# Patient Record
Sex: Male | Born: 1980 | Race: White | Hispanic: Yes | Marital: Single | Smoking: Never smoker
Health system: Southern US, Community
[De-identification: ages and names within clinical notes are randomized; demographics above are authoritative.]

## PROBLEM LIST (undated history)

## (undated) DIAGNOSIS — Z789 Other specified health status: Secondary | ICD-10-CM

## (undated) HISTORY — PX: APPENDECTOMY: SHX54

---

## 2003-08-17 ENCOUNTER — Encounter: Payer: Self-pay | Admitting: Emergency Medicine

## 2003-08-17 ENCOUNTER — Emergency Department (HOSPITAL_COMMUNITY): Admission: EM | Admit: 2003-08-17 | Discharge: 2003-08-17 | Payer: Self-pay | Admitting: Emergency Medicine

## 2008-06-26 ENCOUNTER — Emergency Department (HOSPITAL_COMMUNITY): Admission: EM | Admit: 2008-06-26 | Discharge: 2008-06-26 | Payer: Self-pay | Admitting: Emergency Medicine

## 2008-06-30 ENCOUNTER — Emergency Department (HOSPITAL_COMMUNITY): Admission: EM | Admit: 2008-06-30 | Discharge: 2008-06-30 | Payer: Self-pay | Admitting: Emergency Medicine

## 2019-07-05 ENCOUNTER — Encounter (HOSPITAL_COMMUNITY): Payer: Self-pay | Admitting: Emergency Medicine

## 2019-07-05 ENCOUNTER — Other Ambulatory Visit: Payer: Self-pay

## 2019-07-05 ENCOUNTER — Emergency Department (HOSPITAL_COMMUNITY)
Admission: EM | Admit: 2019-07-05 | Discharge: 2019-07-05 | Disposition: A | Discharge status: Home / Self Care | Payer: Worker's Compensation | Attending: Emergency Medicine | Admitting: Emergency Medicine

## 2019-07-05 ENCOUNTER — Emergency Department (HOSPITAL_COMMUNITY): Payer: Worker's Compensation

## 2019-07-05 ENCOUNTER — Emergency Department: Payer: Self-pay

## 2019-07-05 DIAGNOSIS — Y999 Unspecified external cause status: Secondary | ICD-10-CM | POA: Diagnosis not present

## 2019-07-05 DIAGNOSIS — R52 Pain, unspecified: Secondary | ICD-10-CM

## 2019-07-05 DIAGNOSIS — Y9301 Activity, walking, marching and hiking: Secondary | ICD-10-CM | POA: Diagnosis not present

## 2019-07-05 DIAGNOSIS — Y929 Unspecified place or not applicable: Secondary | ICD-10-CM | POA: Insufficient documentation

## 2019-07-05 DIAGNOSIS — S52571A Other intraarticular fracture of lower end of right radius, initial encounter for closed fracture: Secondary | ICD-10-CM | POA: Diagnosis not present

## 2019-07-05 DIAGNOSIS — W0110XA Fall on same level from slipping, tripping and stumbling with subsequent striking against unspecified object, initial encounter: Secondary | ICD-10-CM | POA: Insufficient documentation

## 2019-07-05 DIAGNOSIS — S6981XA Other specified injuries of right wrist, hand and finger(s), initial encounter: Secondary | ICD-10-CM | POA: Diagnosis present

## 2019-07-05 MED ORDER — HYDROCODONE-ACETAMINOPHEN 5-325 MG PO TABS
1.0000 | ORAL_TABLET | Freq: Once | ORAL | Status: AC
  Administered 2019-07-05: 1 via ORAL
  Filled 2019-07-05: qty 1

## 2019-07-05 MED ORDER — HYDROCODONE-ACETAMINOPHEN 5-325 MG PO TABS: 1.0000 | ORAL_TABLET | Freq: Four times a day (QID) | ORAL | 0 refills | Status: DC | PRN

## 2019-07-05 NOTE — ED Notes (Signed)
Ortho tech at bedside at this time 

## 2019-07-05 NOTE — ED Notes (Signed)
Patient verbalizes understanding of discharge instructions. Opportunity for questioning and answers were provided.  pt discharged from ED with family. Ambulatory by self   

## 2019-07-05 NOTE — ED Notes (Signed)
Radiology brought patient xray CD back and given to patient. Unable to transfer xray from CD to Epic.

## 2019-07-05 NOTE — Progress Notes (Signed)
Orthopedic Tech Progress Note Patient Details:  Angel Yates [birth year] [id number]  Ortho Devices Type of Ortho Device: Long arm splint, Arm sling Ortho Device/Splint Location: right Ortho Device/Splint Interventions: Application   Post Interventions Patient Tolerated: Well Instructions Provided: Care of device   [place] Pink 07/05/2019, 3:43 PM

## 2019-07-05 NOTE — ED Notes (Signed)
Provider at bedside

## 2019-07-05 NOTE — ED Triage Notes (Signed)
Pt fell and injured RUE on Monday, was seen at urgent care this am and told he has a fracture.

## 2019-07-05 NOTE — ED Notes (Signed)
Currently waiting for lab to send COVID swabs currently out in the ED.

## 2019-07-05 NOTE — ED Provider Notes (Signed)
[facility]   CSN: [id number] Arrival date & time: 07/05/19  1058    History   Chief Complaint Chief Complaint  Patient presents with  . Arm Injury    HPI Angel Yates is a 38 y.o. male who presents today for evaluation of right wrist pain.  He reports that on Monday, August 3 he was walking outside when he had a mechanical slip and fell hitting his right wrist.  He reports that over the past 5 days he has been resting at home.  He took ibuprofen 400 mg last night with mild relief of pain.  He denies any other injuries from this fall.  He denies any blood thinning medications.  He denies any numbness or tingling in his right hand.  [facility] show that he had a x-ray of his right hand performed, read as "comminuted intra-articular fracture distal radius.  Moderate dorsal impaction noted.  4 mm gap in the articular surface.  4 mm depression dorsal radius fragment."  He denies any wounds on his arm.  Last oral intake was dinner last night.    Patient was offered professional Orthoptistmedical translator, he refused stating that he wished for his friend in the room to translate for him      HPI  History reviewed. No pertinent past medical history.    There are no active problems to display for this patient.   History reviewed. No pertinent surgical history.      Home Medications    Prior to Admission medications   Medication Sig Start Date End Date Taking? Authorizing Provider  HYDROcodone-acetaminophen (NORCO/VICODIN) 5-325 MG tablet Take 1 tablet by mouth every 6 (six) hours as needed. 07/05/19   Cristina GongHammond, Bertice Risse W, PA-C    Family History No family history on file.  Social History Social History   Tobacco Use  . Smoking status: Never Smoker  . Smokeless tobacco: Never Used  Substance Use Topics  . Alcohol use: Never    Frequency: Never  . Drug use: Never     Allergies    Patient has no allergy information on record.   Review of Systems Review of Systems  Constitutional: Negative for chills and fever.  Musculoskeletal:       Right wrist pain  Skin: Negative for wound.  Neurological: Negative for weakness, numbness and headaches.  All other systems reviewed and are negative.    Physical Exam Updated Vital Signs BP (!) 139/100 (BP Location: Left Arm)   Pulse 72   Temp 98.3 F (36.8 C) (Oral)   Resp 18   SpO2 100%   Physical Exam Vitals signs and nursing note reviewed.  Constitutional:      General: He is not in acute distress.    Appearance: He is well-developed. He is not diaphoretic.  HENT:     Head: Normocephalic and atraumatic.  Eyes:     General: No scleral icterus.       Right eye: No discharge.        Left eye: No discharge.     Conjunctiva/sclera: Conjunctivae normal.  Neck:     Musculoskeletal: Normal range of motion and neck supple.  Cardiovascular:     Rate and Rhythm: Normal rate and regular rhythm.     Pulses: Normal pulses.  Pulmonary:     Effort: Pulmonary effort is normal. No respiratory distress.     Breath sounds: No stridor.  Abdominal:  General: There is no distension.     Tenderness: There is no abdominal tenderness.  Musculoskeletal:        General: No deformity.     Comments: There is an obvious deformity of the right wrist with ulnar deviation.  Finger range of motion on the right hand is limited secondary to wrist pain.  Skin:    General: Skin is warm and dry.     Capillary Refill: Capillary refill takes less than 2 seconds.     Comments: No scrapes, abrasions or lacerations on the right hand/arm.  Neurological:     Mental Status: He is alert.     Sensory: No sensory deficit.     Motor: No abnormal muscle tone.  Psychiatric:        Mood and Affect: Mood normal.        Behavior: Behavior normal.            ED Treatments / Results  Labs (all labs ordered are listed, but only abnormal  results are displayed) Labs Reviewed - No data to display  EKG None  Radiology Dg Forearm Right  Result Date: 07/05/2019 CLINICAL DATA:  Fall with right wrist pain.  Initial encounter. EXAM: RIGHT FOREARM - 2 VIEW COMPARISON:  None. FINDINGS: The entire forearm is in a splint/cast. There is a comminuted and mildly displaced fracture of the distal radius that extends into the radiocarpal joint. No proximal ulnar or radial fractures are identified. Alignment at the elbow appears intact. IMPRESSION: Comminuted fracture of the distal right radius demonstrating mild displacement. Electronically Signed   By: Irish LackGlenn  Yamagata M.D.   On: 07/05/2019 12:23   Dg Wrist Complete Right  Result Date: 07/05/2019 CLINICAL DATA:  Recent fall with wrist pain. EXAM: RIGHT WRIST - COMPLETE 3+ VIEW COMPARISON:  None. FINDINGS: The forearm is splinted. There is a comminuted and displaced intra-articular fracture of the distal radius. This fracture is associated with up to 4 mm of distal articular surface offset and up to 4 mm of posterior displacement. The fracture extends into the distal radioulnar joint. The distal ulna appears intact. No carpal bone fractures are identified. There is an old fracture of distal 5th metacarpal. IMPRESSION: Comminuted and mildly displaced intra-articular fracture of the distal radius as described. Electronically Signed   By: Carey BullocksWilliam  Veazey M.D.   On: 07/05/2019 12:22    Procedures .Splint Application  Date/Time: 07/05/2019 6:28 PM Performed by: Cristina GongHammond, Narek Kniss W, PA-C Authorized by: Cristina GongHammond, Caspar Favila W, PA-C   Consent:    Consent obtained:  Verbal   Consent given by:  Patient   Risks discussed:  Discoloration, numbness, pain and swelling   Alternatives discussed:  No treatment, alternative treatment and referral Pre-procedure details:    Sensation:  Normal   Skin color:  Normal Procedure details:    Laterality:  Right   Location:  Wrist   Wrist:  R wrist   Strapping: no      Splint type:  Thumb spica and sugar tong   Supplies:  Ortho-Glass, sling and cotton padding Post-procedure details:    Pain:  Improved   Sensation:  Normal   Skin color:  Unchanged   Patient tolerance of procedure:  Tolerated well, no immediate complications Comments:     Splint applied by orthopedic tech.    (including critical care time)  Medications Ordered in ED Medications  HYDROcodone-acetaminophen (NORCO/VICODIN) 5-325 MG per tablet 1 tablet (1 tablet Oral Given 07/05/19 1545)     Initial Impression /  Assessment and Plan / ED Course  I have reviewed the triage vital signs and the nursing notes.  Pertinent labs & imaging results that were available during my care of the patient were reviewed by me and considered in my medical decision making (see chart for details).  Clinical Course as of Jul 05 1827  Sat Jul 05, 2019  1529 Spoke with on-call hand who recommends splinting patient.  Have him call the office on Monday for an appointment on Tuesday.   [EH]  [place] in room.   [EH]    Clinical Course User Index [EH] Lorin Glass, PA-C      Patient presents today for evaluation of pain in his right wrist.  He was originally seen at urgent care where hand x-rays were obtained and he was reportedly diagnosed with a distal radial fracture.  He has significant scaphoid tenderness, therefore wrist specific and forearm images were obtained to evaluate scaphoid and remainder of the radius.  His fall happened 5 days ago.  He is neurovascularly intact on my exam.  I spoke with on-call hand surgeon who recommended splint and sling and follow-up in the office. The splint the patient arrived with was insufficient.  Splint was redone while in the department.  After which he reported improvement in his pain.  His pain is treated with Vicodin while in the emergency room, in addition to given a prescription for additional doses of Vicodin with instructions on over-the-counter  medications.  I attempted to use professional medical interpreter, however patient refused wishing for his friend to interpret for him as needed.  Return precautions were discussed with patient who states their understanding.  At the time of discharge patient denied any unaddressed complaints or concerns.  Patient is agreeable for discharge home.   Final Clinical Impressions(s) / ED Diagnoses   Final diagnoses:  Pain  Other closed intra-articular fracture of distal end of right radius, initial encounter    ED Discharge Orders         Ordered    HYDROcodone-acetaminophen (NORCO/VICODIN) 5-325 MG tablet  Every 6 hours PRN     07/05/19 1615           Lorin Glass, Hershal Coria 07/05/19 1833    Virgel Manifold, MD 07/09/19 1455

## 2019-07-05 NOTE — Discharge Instructions (Signed)

## 2019-07-05 NOTE — ED Notes (Signed)
Patient transported to X-ray 

## 2019-07-10 ENCOUNTER — Other Ambulatory Visit (HOSPITAL_COMMUNITY)
Admission: RE | Admit: 2019-07-10 | Discharge: 2019-07-10 | Disposition: A | Discharge status: Home / Self Care | Payer: HRSA Program | Source: Ambulatory Visit | Attending: Orthopaedic Surgery | Admitting: Orthopaedic Surgery

## 2019-07-10 DIAGNOSIS — Z01812 Encounter for preprocedural laboratory examination: Secondary | ICD-10-CM | POA: Diagnosis not present

## 2019-07-10 DIAGNOSIS — Z20828 Contact with and (suspected) exposure to other viral communicable diseases: Secondary | ICD-10-CM | POA: Insufficient documentation

## 2019-07-11 ENCOUNTER — Encounter (HOSPITAL_COMMUNITY): Payer: Self-pay | Admitting: *Deleted

## 2019-07-11 ENCOUNTER — Other Ambulatory Visit: Payer: Self-pay

## 2019-07-11 LAB — SARS CORONAVIRUS 2 (TAT 6-24 HRS): SARS Coronavirus 2: NEGATIVE

## 2019-07-11 NOTE — Progress Notes (Signed)
Spoke with pt for pre-op call via [employer], [place] [phone]. Pt denies cardiac history, HTN or Diabetes.  Pt had Covid test done yesterday and it is negative. Pt states he's been in quarantine since testing.   Pt states he will have someone come to the hospital today to pick up the [employer] Ensure drink. Pt understands he drinks it at 6:15 AM Monday, 07/14/19.    Coronavirus Screening  Have you experienced the following symptoms:  Cough NO Fever (>100.36F) NO Runny nose NO Sore throat NO Difficulty breathing/shortness of breath  NO  Have you or a family member traveled in the last 14 days and where? NO   Patient reminded that hospital visitation restrictions are in effect and the importance of the restrictions. Pt informed he may have 1 visitor sit in the waiting room while he is in pre-op, surgery and PACU. He voiced understanding.

## 2019-07-14 ENCOUNTER — Encounter (HOSPITAL_COMMUNITY): Payer: Self-pay | Admitting: *Deleted

## 2019-07-14 ENCOUNTER — Other Ambulatory Visit: Payer: Self-pay

## 2019-07-14 ENCOUNTER — Observation Stay (HOSPITAL_COMMUNITY)
Admission: RE | Admit: 2019-07-14 | Discharge: 2019-07-15 | Disposition: A | Discharge status: Home / Self Care | Payer: Worker's Compensation | Attending: Orthopaedic Surgery | Admitting: Orthopaedic Surgery

## 2019-07-14 ENCOUNTER — Ambulatory Visit (HOSPITAL_COMMUNITY): Payer: Worker's Compensation | Admitting: Certified Registered"

## 2019-07-14 ENCOUNTER — Encounter (HOSPITAL_COMMUNITY)
Admission: RE | Disposition: A | Discharge status: Home / Self Care | Payer: Self-pay | Source: Home / Self Care | Attending: Orthopaedic Surgery

## 2019-07-14 DIAGNOSIS — S52571A Other intraarticular fracture of lower end of right radius, initial encounter for closed fracture: Secondary | ICD-10-CM | POA: Diagnosis not present

## 2019-07-14 DIAGNOSIS — W19XXXA Unspecified fall, initial encounter: Secondary | ICD-10-CM | POA: Insufficient documentation

## 2019-07-14 DIAGNOSIS — S52501A Unspecified fracture of the lower end of right radius, initial encounter for closed fracture: Secondary | ICD-10-CM | POA: Diagnosis present

## 2019-07-14 HISTORY — PX: ORIF RADIAL FRACTURE: SHX5113

## 2019-07-14 HISTORY — PX: ORIF WRIST FRACTURE: SHX2133

## 2019-07-14 LAB — HEMOGLOBIN: Hemoglobin: 15.8 g/dL (ref 13.0–17.0)

## 2019-07-14 SURGERY — OPEN REDUCTION INTERNAL FIXATION (ORIF) WRIST FRACTURE
Anesthesia: Monitor Anesthesia Care | Laterality: Right

## 2019-07-14 MED ORDER — ROPIVACAINE HCL 5 MG/ML IJ SOLN
INTRAMUSCULAR | Status: DC | PRN
  Administered 2019-07-14: 30 mL via PERINEURAL

## 2019-07-14 MED ORDER — MIDAZOLAM HCL 2 MG/2ML IJ SOLN
INTRAMUSCULAR | Status: AC
  Administered 2019-07-14: 2 mg
  Filled 2019-07-14: qty 2

## 2019-07-14 MED ORDER — OXYCODONE HCL 5 MG/5ML PO SOLN: 5.0000 mg | Freq: Once | ORAL | Status: DC | PRN

## 2019-07-14 MED ORDER — LIDOCAINE HCL (CARDIAC) PF 100 MG/5ML IV SOSY
PREFILLED_SYRINGE | INTRAVENOUS | Status: DC | PRN
  Administered 2019-07-14: 40 mg via INTRATRACHEAL

## 2019-07-14 MED ORDER — OXYCODONE HCL 5 MG PO TABS: 5.0000 mg | ORAL_TABLET | Freq: Once | ORAL | Status: DC | PRN

## 2019-07-14 MED ORDER — FENTANYL CITRATE (PF) 100 MCG/2ML IJ SOLN
INTRAMUSCULAR | Status: DC | PRN
  Administered 2019-07-14 (×2): 50 ug via INTRAVENOUS

## 2019-07-14 MED ORDER — DEXAMETHASONE SODIUM PHOSPHATE 10 MG/ML IJ SOLN
INTRAMUSCULAR | Status: DC | PRN
  Administered 2019-07-14: 5 mg

## 2019-07-14 MED ORDER — ONDANSETRON HCL 4 MG/2ML IJ SOLN: 4.0000 mg | Freq: Four times a day (QID) | INTRAMUSCULAR | Status: DC | PRN

## 2019-07-14 MED ORDER — FENTANYL CITRATE (PF) 100 MCG/2ML IJ SOLN: 100.0000 ug | Freq: Once | INTRAMUSCULAR | Status: DC

## 2019-07-14 MED ORDER — FENTANYL CITRATE (PF) 100 MCG/2ML IJ SOLN
INTRAMUSCULAR | Status: AC
  Administered 2019-07-14: 09:00:00 100 ug
  Filled 2019-07-14: qty 2

## 2019-07-14 MED ORDER — PROPOFOL 10 MG/ML IV BOLUS
INTRAVENOUS | Status: AC
  Filled 2019-07-14: qty 20

## 2019-07-14 MED ORDER — HYDROCODONE-ACETAMINOPHEN 7.5-325 MG PO TABS
1.0000 | ORAL_TABLET | ORAL | Status: DC | PRN
  Administered 2019-07-14 – 2019-07-15 (×2): 2 via ORAL
  Filled 2019-07-14 (×2): qty 2

## 2019-07-14 MED ORDER — MORPHINE SULFATE (PF) 2 MG/ML IV SOLN: 0.5000 mg | INTRAVENOUS | Status: DC | PRN

## 2019-07-14 MED ORDER — CHLORHEXIDINE GLUCONATE 4 % EX LIQD: 60.0000 mL | Freq: Once | CUTANEOUS | Status: DC

## 2019-07-14 MED ORDER — 0.9 % SODIUM CHLORIDE (POUR BTL) OPTIME
TOPICAL | Status: DC | PRN
  Administered 2019-07-14: 1000 mL

## 2019-07-14 MED ORDER — ENSURE PRE-SURGERY PO LIQD: 296.0000 mL | Freq: Once | ORAL | Status: DC

## 2019-07-14 MED ORDER — ACETAMINOPHEN 325 MG PO TABS: 325.0000 mg | ORAL_TABLET | Freq: Four times a day (QID) | ORAL | Status: DC | PRN

## 2019-07-14 MED ORDER — DIPHENHYDRAMINE HCL 25 MG PO CAPS: 25.0000 mg | ORAL_CAPSULE | Freq: Four times a day (QID) | ORAL | Status: DC | PRN

## 2019-07-14 MED ORDER — ONDANSETRON HCL 4 MG/2ML IJ SOLN
INTRAMUSCULAR | Status: AC
  Filled 2019-07-14: qty 2

## 2019-07-14 MED ORDER — CEFAZOLIN SODIUM-DEXTROSE 2-4 GM/100ML-% IV SOLN
2.0000 g | INTRAVENOUS | Status: AC
  Administered 2019-07-14: 2 g via INTRAVENOUS
  Filled 2019-07-14: qty 100

## 2019-07-14 MED ORDER — MIDAZOLAM HCL 5 MG/5ML IJ SOLN
INTRAMUSCULAR | Status: DC | PRN
  Administered 2019-07-14: 2 mg via INTRAVENOUS

## 2019-07-14 MED ORDER — ONDANSETRON HCL 4 MG PO TABS: 4.0000 mg | ORAL_TABLET | Freq: Four times a day (QID) | ORAL | Status: DC | PRN

## 2019-07-14 MED ORDER — PROPOFOL 500 MG/50ML IV EMUL
INTRAVENOUS | Status: DC | PRN
  Administered 2019-07-14: 75 ug/kg/min via INTRAVENOUS

## 2019-07-14 MED ORDER — FENTANYL CITRATE (PF) 250 MCG/5ML IJ SOLN
INTRAMUSCULAR | Status: AC
  Filled 2019-07-14: qty 5

## 2019-07-14 MED ORDER — MIDAZOLAM HCL 2 MG/2ML IJ SOLN
INTRAMUSCULAR | Status: AC
  Filled 2019-07-14: qty 2

## 2019-07-14 MED ORDER — LACTATED RINGERS IV SOLN
INTRAVENOUS | Status: DC
  Administered 2019-07-14 (×2): via INTRAVENOUS

## 2019-07-14 MED ORDER — ONDANSETRON HCL 4 MG/2ML IJ SOLN
INTRAMUSCULAR | Status: DC | PRN
  Administered 2019-07-14: 4 mg via INTRAVENOUS

## 2019-07-14 MED ORDER — POVIDONE-IODINE 10 % EX SWAB: 2.0000 "application " | Freq: Once | CUTANEOUS | Status: DC

## 2019-07-14 MED ORDER — LIDOCAINE 2% (20 MG/ML) 5 ML SYRINGE
INTRAMUSCULAR | Status: AC
  Filled 2019-07-14: qty 5

## 2019-07-14 MED ORDER — ONDANSETRON HCL 4 MG/2ML IJ SOLN: 4.0000 mg | Freq: Once | INTRAMUSCULAR | Status: DC | PRN

## 2019-07-14 MED ORDER — HYDROCODONE-ACETAMINOPHEN 5-325 MG PO TABS
1.0000 | ORAL_TABLET | ORAL | Status: DC | PRN
  Administered 2019-07-15 (×2): 2 via ORAL
  Filled 2019-07-14 (×2): qty 2

## 2019-07-14 MED ORDER — MIDAZOLAM HCL 2 MG/2ML IJ SOLN: 2.0000 mg | Freq: Once | INTRAMUSCULAR | Status: DC

## 2019-07-14 MED ORDER — FENTANYL CITRATE (PF) 100 MCG/2ML IJ SOLN: 25.0000 ug | INTRAMUSCULAR | Status: DC | PRN

## 2019-07-14 MED ORDER — HYDROCODONE-ACETAMINOPHEN 5-325 MG PO TABS: 1.0000 | ORAL_TABLET | Freq: Four times a day (QID) | ORAL | 0 refills | Status: DC | PRN

## 2019-07-14 SURGICAL SUPPLY — 66 items
ALCOHOL 70% 16 OZ (MISCELLANEOUS) ×2 IMPLANT
BIT DRILL 2.0 LNG QUCK RELEASE (BIT) IMPLANT
BIT DRILL 2.8 QUICK RELEASE (BIT) IMPLANT
BNDG ELASTIC 3X5.8 VLCR STR LF (GAUZE/BANDAGES/DRESSINGS) ×2 IMPLANT
BNDG ELASTIC 4X5.8 VLCR STR LF (GAUZE/BANDAGES/DRESSINGS) ×2 IMPLANT
BNDG ESMARK 4X9 LF (GAUZE/BANDAGES/DRESSINGS) ×2 IMPLANT
BNDG GAUZE ELAST 4 BULKY (GAUZE/BANDAGES/DRESSINGS) ×4 IMPLANT
CANISTER SUCT 3000ML PPV (MISCELLANEOUS) ×2 IMPLANT
CORD BIPOLAR FORCEPS 12FT (ELECTRODE) ×2 IMPLANT
COVER SURGICAL LIGHT HANDLE (MISCELLANEOUS) ×2 IMPLANT
COVER WAND RF STERILE (DRAPES) ×2 IMPLANT
CUFF TOURN SGL QUICK 18X4 (TOURNIQUET CUFF) ×2 IMPLANT
CUFF TOURN SGL QUICK 24 (TOURNIQUET CUFF)
CUFF TRNQT CYL 24X4X16.5-23 (TOURNIQUET CUFF) IMPLANT
DRAPE OEC MINIVIEW 54X84 (DRAPES) IMPLANT
DRAPE SURG 17X23 STRL (DRAPES) ×2 IMPLANT
DRILL 2.0 LNG QUICK RELEASE (BIT) ×2
DRILL 2.8 QUICK RELEASE (BIT) ×2
DRSG XEROFORM 1X8 (GAUZE/BANDAGES/DRESSINGS) ×1 IMPLANT
GAUZE SPONGE 4X4 12PLY STRL (GAUZE/BANDAGES/DRESSINGS) ×2 IMPLANT
GAUZE SPONGE 4X4 16PLY XRAY LF (GAUZE/BANDAGES/DRESSINGS) ×1 IMPLANT
GAUZE XEROFORM 1X8 LF (GAUZE/BANDAGES/DRESSINGS) ×2 IMPLANT
GLOVE INDICATOR 8.0 STRL GRN (GLOVE) ×2 IMPLANT
GLOVE SURG SYN 7.5  E (GLOVE) ×1
GLOVE SURG SYN 7.5 E (GLOVE) ×1 IMPLANT
GLOVE SURG SYN 7.5 PF PI (GLOVE) ×1 IMPLANT
GOWN STRL REUS W/ TWL LRG LVL3 (GOWN DISPOSABLE) ×2 IMPLANT
GOWN STRL REUS W/TWL LRG LVL3 (GOWN DISPOSABLE) ×2
GUIDEWIRE ORTHO 0.054X6 (WIRE) ×3 IMPLANT
KIT BASIN OR (CUSTOM PROCEDURE TRAY) ×2 IMPLANT
KIT TURNOVER KIT B (KITS) ×2 IMPLANT
LOOP VESSEL MAXI BLUE (MISCELLANEOUS) IMPLANT
MANIFOLD NEPTUNE II (INSTRUMENTS) ×2 IMPLANT
NEEDLE 22X1 1/2 (OR ONLY) (NEEDLE) IMPLANT
NS IRRIG 1000ML POUR BTL (IV SOLUTION) ×2 IMPLANT
PACK ORTHO EXTREMITY (CUSTOM PROCEDURE TRAY) ×2 IMPLANT
PAD ARMBOARD 7.5X6 YLW CONV (MISCELLANEOUS) ×4 IMPLANT
PAD CAST 3X4 CTTN HI CHSV (CAST SUPPLIES) ×1 IMPLANT
PAD CAST 4YDX4 CTTN HI CHSV (CAST SUPPLIES) ×1 IMPLANT
PADDING CAST COTTON 3X4 STRL (CAST SUPPLIES) ×1
PADDING CAST COTTON 4X4 STRL (CAST SUPPLIES) ×1
PLATE STD RT ACULOC 2 (Plate) ×1 IMPLANT
SCREW BN FT 16X2.3XLCK HEX CRT (Screw) IMPLANT
SCREW CORT FT 18X2.3XLCK HEX (Screw) IMPLANT
SCREW CORT FT 20X2.3XLCK HEX (Screw) IMPLANT
SCREW CORT FT 22X2.3XLCK HEX (Screw) IMPLANT
SCREW CORTICAL LOCKING 2.3X16M (Screw) ×1 IMPLANT
SCREW CORTICAL LOCKING 2.3X18M (Screw) ×3 IMPLANT
SCREW CORTICAL LOCKING 2.3X20M (Screw) ×2 IMPLANT
SCREW CORTICAL LOCKING 2.3X22M (Screw) ×1 IMPLANT
SCREW HEXALOBE LOCKING 3.5X14M (Screw) ×1 IMPLANT
SCREW HEXALOBE LOCKING 3.5X16M (Screw) ×1 IMPLANT
SCREW HEXALOBE NON-LOCK 3.5X16 (Screw) ×1 IMPLANT
SCREW NON TOGGLE 2.3X26 (Screw) ×1 IMPLANT
SOL PREP POV-IOD 4OZ 10% (MISCELLANEOUS) ×4 IMPLANT
SPLINT FIBERGLASS 3X12 (CAST SUPPLIES) ×1 IMPLANT
SPONGE LAP 4X18 RFD (DISPOSABLE) IMPLANT
SUT MNCRL AB 4-0 PS2 18 (SUTURE) IMPLANT
SUT PROLENE 3 0 PS 2 (SUTURE) IMPLANT
SUT PROLENE 4 0 PS 2 18 (SUTURE) IMPLANT
SUT VIC AB 3-0 FS2 27 (SUTURE) IMPLANT
SYR CONTROL 10ML LL (SYRINGE) IMPLANT
TOWEL GREEN STERILE (TOWEL DISPOSABLE) ×2 IMPLANT
TOWEL GREEN STERILE FF (TOWEL DISPOSABLE) ×2 IMPLANT
TUBE CONNECTING 12X1/4 (SUCTIONS) ×2 IMPLANT
WATER STERILE IRR 1000ML POUR (IV SOLUTION) ×2 IMPLANT

## 2019-07-14 NOTE — Transfer of Care (Signed)
Immediate Anesthesia Transfer of Care Note  Patient: Lew Prout  Procedure(s) Performed: Right distal radius open reduction, internal fixation and surgery as indicated (Right )  Patient Location: PACU  Anesthesia Type:MAC and Regional  Level of Consciousness: awake, alert , oriented and sedated  Airway & Oxygen Therapy: Patient Spontanous Breathing and Patient connected to nasal cannula oxygen  Post-op Assessment: Report given to RN, Post -op Vital signs reviewed and stable and Patient moving all extremities  Post vital signs: Reviewed and stable  Last Vitals:  Vitals Value Taken Time  BP 111/64 07/14/19 1027  Temp    Pulse 85 07/14/19 1031  Resp 17 07/14/19 1031  SpO2 97 % 07/14/19 1031  Vitals shown include unvalidated device data.  Last Pain:  Vitals:   07/14/19 0735  TempSrc:   PainSc: 0-No pain      Patients Stated Pain Goal: 5 (40/97/35 3299)  Complications: No apparent anesthesia complications

## 2019-07-14 NOTE — H&P (Signed)
ORTHOPAEDIC H&P  PCP:  Patient, No Pcp Per  Chief Complaint: Right distal radius fracture  HPI: Angel Yates is a 38 y.o. male who complains of  Right distal radius fracture. He fell approximately 2 weeks ago onto an outstretched right hand. He presented to the ED about 5 days later where he was found to have a displaced right distal radius fracture. We discussed treatment options and he presents today for operative fixation of the right wrist.  History reviewed. No pertinent past medical history. History reviewed. No pertinent surgical history. Social History   Socioeconomic History  . Marital status: Single    Spouse name: Not on file  . Number of children: Not on file  . Years of education: Not on file  . Highest education level: Not on file  Occupational History  . Not on file  Social Needs  . Financial resource strain: Not on file  . Food insecurity    Worry: Not on file    Inability: Not on file  . Transportation needs    Medical: Not on file    Non-medical: Not on file  Tobacco Use  . Smoking status: Never Smoker  . Smokeless tobacco: Never Used  Substance and Sexual Activity  . Alcohol use: Never    Frequency: Never  . Drug use: Never  . Sexual activity: Not on file  Lifestyle  . Physical activity    Days per week: Not on file    Minutes per session: Not on file  . Stress: Not on file  Relationships  . Social Musicianconnections    Talks on phone: Not on file    Gets together: Not on file    Attends religious service: Not on file    Active member of club or organization: Not on file    Attends meetings of clubs or organizations: Not on file    Relationship status: Not on file  Other Topics Concern  . Not on file  Social History Narrative  . Not on file   History reviewed. No pertinent family history. No Known Allergies Prior to Admission medications   Medication Sig Start Date End Date Taking? Authorizing Provider  HYDROcodone-acetaminophen  (NORCO/VICODIN) 5-325 MG tablet Take 1 tablet by mouth every 6 (six) hours as needed. Patient taking differently: Take 1 tablet by mouth every 6 (six) hours as needed (for pain).  07/05/19  Yes Cristina GongHammond, Elizabeth W, PA-C   No results found.  Positive ROS: All other systems have been reviewed and were otherwise negative with the exception of those mentioned in the HPI and as above.  Physical Exam: General: Alert, no acute distress Cardiovascular: No pedal edema Respiratory: No cyanosis, no use of accessory musculature Skin: No lesions in the area of chief complaint Neurologic: Sensation intact distally Psychiatric: Patient is competent for consent with normal mood and affect Lymphatic: No axillary or cervical lymphadenopathy  MUSCULOSKELETAL: Examination of the right upper extremity shows a well fitting sugar tong splint. The exposed arm and digits are without swelling, lymphadenopathy, erythema or signs of infection. He has tenderness palpation through his splint along both the radial and ulnar aspects of the distal forearm. He was left in his splint to allow for patient comfort. Range of motion of the elbow and forearm were not evaluated. His motion to his digits is intact though limited some by his splint. His finger tips are all well perfused with brisk capillary refill. His sensation is grossly intact light touch at all digits.  Assessment: Right displaced intra-articular distal radius fracture  Plan: - OR today for ORIF right distal radius fracture - r/b/a discussed with patient once again. Consent obtained. Marked - Plan for d/c home post op    Verner Mould, MD Cell [phone]   07/14/2019 7:19 AM

## 2019-07-14 NOTE — Evaluation (Signed)
Physical Therapy Evaluation & Discharge Patient Details Name: Angel Yates MRN: [id number] DOB: 09-Apr-1981 Today's Date: 07/14/2019   History of Present Illness  Pt is a 38 y.o. male admitted 07/14/19 for R wrist ORIF; pt fell on outstretched hand approximately 2 weeks ago, found to have displaced R distal radius fx. No PMH on file.    Clinical Impression  Patient evaluated by Physical Therapy with no further acute PT needs identified. PTA, pt indep and lives alone; L-hand dominant. Today, pt indep with mobility, able to perform ADL tasks well with single UE. Educ re: edema control, shoulder/elbow/finger ROM. All education has been completed and the patient has no further questions. Acute PT is signing off. Thank you for this referral.    Follow Up Recommendations No PT follow up    Equipment Recommendations  None recommended by PT    Recommendations for Other Services       Precautions / Restrictions Precautions Required Braces or Orthoses: Sling Restrictions Other Position/Activity Restrictions: Assume R wrist NWB      Mobility  Bed Mobility Overal bed mobility: Independent                Transfers Overall transfer level: Independent                  Ambulation/Gait Ambulation/Gait assistance: Independent   Assistive device: None Gait Pattern/deviations: WFL(Within Functional Limits)        Stairs            Wheelchair Mobility    Modified Rankin (Stroke Patients Only)       Balance Overall balance assessment: No apparent balance deficits (not formally assessed)                                           Pertinent Vitals/Pain Pain Assessment: No/denies pain(still numb post-op)    Home Living Family/patient expects to be discharged to:: Private residence Living Arrangements: Alone Available Help at Discharge: Family;Friend(s);Available PRN/intermittently Type of Home: House Home Access: Level entry     Home  Layout: One level Home Equipment: None      Prior Function Level of Independence: Independent               Hand Dominance        Extremity/Trunk Assessment   Upper Extremity Assessment Upper Extremity Assessment: RUE deficits/detail RUE Deficits / Details: s/p wrist ORIF; shoulder and elbow WFL    Lower Extremity Assessment Lower Extremity Assessment: Overall WFL for tasks assessed       Communication   Communication: Prefers language other than [place];Interpreter utilized  Cognition Arousal/Alertness: Awake/alert Behavior During Therapy: WFL for tasks assessed/performed Overall Cognitive Status: Within Functional Limits for tasks assessed                                        General Comments General comments (skin integrity, edema, etc.): Pt indep performing ADLs with LUE; declined assist to don clothes    Exercises Other Exercises Other Exercises: Educ re: shoulder, elbow, finger ROM; ice/elevation   Assessment/Plan    PT Assessment Patent does not need any further PT services  PT Problem List         PT Treatment Interventions      PT Goals (Current goals can be found  in the Care Plan section)  Acute Rehab PT Goals PT Goal Formulation: All assessment and education complete, DC therapy    Frequency     Barriers to discharge        Co-evaluation               AM-PAC PT "6 Clicks" Mobility  Outcome Measure Help needed turning from your back to your side while in a flat bed without using bedrails?: None Help needed moving from lying on your back to sitting on the side of a flat bed without using bedrails?: None Help needed moving to and from a bed to a chair (including a wheelchair)?: None Help needed standing up from a chair using your arms (e.g., wheelchair or bedside chair)?: None Help needed to walk in hospital room?: None Help needed climbing 3-5 steps with a railing? : None 6 Click Score: 24    End of Session  Equipment Utilized During Treatment: Gait belt Activity Tolerance: Patient tolerated treatment well Patient left: in bed;with call bell/phone within reach Nurse Communication: Mobility status PT Visit Diagnosis: Other abnormalities of gait and mobility (R26.89)    Time: [id number] PT Time Calculation (min) (ACUTE ONLY): 19 min   Charges:   PT Evaluation $PT Eval Low Complexity: 1 Low     Ina HomesJaclyn Providencia Hottenstein, PT, DPT Acute Rehabilitation Services  Pager [phone] Office [phone]  Malachy ChamberJaclyn L Romone Shaff 07/14/2019, 4:38 PM

## 2019-07-14 NOTE — Anesthesia Procedure Notes (Signed)
Anesthesia Regional Block: Supraclavicular block   Pre-Anesthetic Checklist: ,, timeout performed, Correct Patient, Correct Site, Correct Laterality, Correct Procedure, Correct Position, site marked, Risks and benefits discussed,  Surgical consent,  Pre-op evaluation,  At surgeon's request and post-op pain management  Laterality: Right  Prep: chloraprep       Needles:  Injection technique: Single-shot  Needle Type: Echogenic Stimulator Needle     Needle Length: 9cm  Needle Gauge: 21     Additional Needles:   Procedures:,,,, ultrasound used (permanent image in chart),,,,  Narrative:  Start time: 07/14/2019 8:45 AM End time: 07/14/2019 8:49 AM Injection made incrementally with aspirations every 5 mL.  Performed by: Personally  Anesthesiologist: Lidia Collum, MD  Additional Notes: Monitors applied. Injection made in 5cc increments. No resistance to injection. Good needle visualization. Patient tolerated procedure well.

## 2019-07-14 NOTE — Op Note (Signed)
PREOPERATIVE DIAGNOSIS: Displaced right three-part intra-articular distal radius fracture  POSTOPERATIVE DIAGNOSIS: Same  ATTENDING PHYSICIAN: Maudry Mayhew. Jeannie Fend, III, MD who was present and scrubbed for the entire case   ASSISTANT SURGEON: None.   ANESTHESIA: Regional with MAC  SURGICAL PROCEDURES: Open reduction internal fixation of displaced, three-part intra-articular distal radius fracture  SURGICAL INDICATIONS: Patient is a 38 year old male who sustained a fall approximately 2 weeks ago.  He was subsequently seen in the ER 5 days after his injury where he was found to have a displaced, intra-articular 3 part distal radius fracture.  He underwent splint immobilization was seen by me in clinic.  After discussing treatment options he did wish to proceed with operative fixation as he had articular step-off of his fracture as well as loss of radial height and increased dorsal inclination.  He presented today for operative fixation.  FINDING: There was a comminuted, displaced distal radius fracture.  There was a large radial styloid as well as lunate fossa fragment.  These were reduced in near-anatomic alignment and held in place with a volar locking plate and screw construct.  DESCRIPTION OF PROCEDURE: Patient was identified in the preoperative holding area where the risk benefits and alternatives of the procedure were discussed with the patient.  These include but are not limited to infection, bleeding, damage to surrounding structures including blood vessels and nerves, pain, stiffness, malunion, nonunion, implant failure and need for additional procedures.  Informed consent was obtained at that time the patient's right wrist was marked with surgical marking pen.  He then underwent a right upper extremity plexus block by anesthesia.  He was brought to the operative suite where timeout was performed identifying the correct patient operative site.  He was positioned supine on operative table with  his hand outstretched on a hand table.  Tourniquet was placed on the upper arm the arm was then prepped and draped in usual sterile fashion.  The patient was induced under MAC sedation.  The limb was exsanguinated and the tourniquet was inflated.  Standard FCR approach was utilized with a longitudinal incision made over the volar FCR tendon.  The sheath over the tendon was incised and the tendon was mobilized radially.  The deep sub-sheath was also incised in line with the tendon.  The deep FPL muscle and tendon were bluntly dissected and mobilized ulnarly.  Pronator quadratus was then incised and elevated off the volar, distal radius.  The volar aspect of the fracture was then visualized distally.  Soft tissue was resected about the fracture site to allow for full visualization.  There was a large radial styloid as well as a lunate fossa piece.  These were reduced with traction as well as elevation of the fracture segments using a Soil scientist.  With longitudinal traction, volar flexion of the wrist as well as ulnar deviation through the thumb near anatomic alignment of the fracture components were achieved.  These were pinned in place with a K wire through the radial styloid.  An Acumed volar locking plate, distal standard sized was then pinned into place.  It was confirmed to be in appropriate position and alignment of under fluoroscopic images.  Kickstand screw was placed proximally.  The distal screws were then placed with first a nonlocking cortical screw followed by locking screws in the distal row.  The cortical screw was removed and replaced with a locking screw.  All distal holes were filled including the radial styloid.  Once this was achieved kickstand and K wires  were removed and the plate was reduced down to the volar radial shaft.  This elevated up the articular segment and improved the dorsal inclination.  A single cortical screw was placed through the oblong hole in the plate followed by 2  locking screws both proximal and distal to this.  Fluoroscopic images were once again obtained which showed near anatomic alignment in both the AP and lateral planes.  Fossa lateral radiograph was obtained which showed appropriate screw positioning and maintenance of extra articular screw placement.  The wound was then copiously irrigated with normal saline.  The skin was closed with interrupted 4-0 Prolene sutures.  Xeroform, 4 x 4's and a well-padded volar slab splint were then placed.  Tourniquet was released and the patient had return of brisk capillary refill to all of his digits.  He was awoken from his sedation and taken to the PACU in stable condition.  He tolerated the procedure well there were no complications.  RADIOGRAPHIC INTERPRETATION: 3 views of the right wrist including AP, lateral and fossa lateral images were obtained intraoperative under fluoroscopic images.  These show interval reduction of the displaced, intra-articular distal radius fracture in near-anatomic alignment.  Volar locking plate and screw construct is visualized without evidence of malpositioning.  ESTIMATED BLOOD LOSS: Less than 10 amounts  TOURNIQUET TIME: 40 minutes  SPECIMENS: None  POSTOPERATIVE PLAN: The patient will be discharged home and seen back  in the office in approximately 10-12 days for wound check, suture  removal, and then be sent to a therapist for a wrist splint as well as gentle, early range of motion exercises.  IMPLANTS: Acumed distal, standard sized volar locking plate

## 2019-07-14 NOTE — Progress Notes (Signed)
OT Cancellation Note  Patient Details Name: Angel Yates MRN: [id number] DOB: Oct 23, 1981   Cancelled Treatment:    Reason Eval/Treat Not Completed: OT screened, no needs identified, will sign off.  Spoke with PT who just saw pt and addressed all education re: edema control, ROM of digits and shoulder, and pt was dressing self without assist, per PT.   Lucille Passy, OTR/L [place] Pager [phone] Office [phone]   Lucille Passy M 07/14/2019, 3:06 PM

## 2019-07-14 NOTE — Anesthesia Preprocedure Evaluation (Addendum)
Anesthesia Evaluation  Patient identified by MRN, date of birth, ID band Patient awake    Reviewed: Allergy & Precautions, NPO status , Patient's Chart, lab work & pertinent test results  History of Anesthesia Complications Negative for: history of anesthetic complications  Airway Mallampati: III  TM Distance: >3 FB Neck ROM: Full    Dental  (+) Teeth Intact   Pulmonary neg pulmonary ROS,    Pulmonary exam normal        Cardiovascular negative cardio ROS Normal cardiovascular exam     Neuro/Psych negative neurological ROS  negative psych ROS   GI/Hepatic negative GI ROS, Neg liver ROS,   Endo/Other  negative endocrine ROS  Renal/GU negative Renal ROS  negative genitourinary   Musculoskeletal negative musculoskeletal ROS (+)   Abdominal   Peds  Hematology negative hematology ROS (+)   Anesthesia Other Findings   Reproductive/Obstetrics                            Anesthesia Physical Anesthesia Plan  ASA: I  Anesthesia Plan: MAC and Regional   Post-op Pain Management:  Regional for Post-op pain   Induction: Intravenous  PONV Risk Score and Plan: 1 and Propofol infusion, TIVA, Treatment may vary due to age or medical condition and Midazolam  Airway Management Planned: Natural Airway, Nasal Cannula and Simple Face Mask  Additional Equipment: None  Intra-op Plan:   Post-operative Plan:   Informed Consent: I have reviewed the patients History and Physical, chart, labs and discussed the procedure including the risks, benefits and alternatives for the proposed anesthesia with the patient or authorized representative who has indicated his/her understanding and acceptance.       Plan Discussed with:   Anesthesia Plan Comments:        Anesthesia Quick Evaluation

## 2019-07-14 NOTE — Discharge Instructions (Addendum)
Discharge Instructions ° °- Keep dressings in place. Do not remove them. °- The dressings must stay dry °- Take all medication as prescribed. Transition to over the counter pain medication as your pain improves °- Keep the hand elevated over the next 48-72 hours to help with pain and swelling °- Move all digits not restricted by the dressings regularly to prevent stiffness °- Please call to schedule a follow up appointment with Dr. Creighton at [phone] for 10-14 days following surgery °- Your pain medication have been send digitally to your pharmacy ° °

## 2019-07-14 NOTE — Anesthesia Postprocedure Evaluation (Signed)
Anesthesia Post Note  Patient: Angel Yates  Procedure(s) Performed: Right distal radius open reduction, internal fixation and surgery as indicated (Right )     Patient location during evaluation: PACU Anesthesia Type: Regional Level of consciousness: awake and alert Pain management: pain level controlled Vital Signs Assessment: post-procedure vital signs reviewed and stable Respiratory status: spontaneous breathing, nonlabored ventilation and respiratory function stable Cardiovascular status: blood pressure returned to baseline and stable Postop Assessment: no apparent nausea or vomiting Anesthetic complications: no    Last Vitals:  Vitals:   07/14/19 1200 07/14/19 1230  BP: 103/73 114/77  Pulse: 76 70  Resp:  16  Temp:  36.4 C  SpO2: 99% 99%    Last Pain:  Vitals:   07/14/19 1230  TempSrc: Oral  PainSc:                  Lidia Collum

## 2019-07-14 NOTE — Progress Notes (Signed)
Orthopedic Tech Progress Note Patient Details:  Angel Yates 10-10-1981 [id number] PACU RN called requesting arm sling for patient Ortho Devices Type of Ortho Device: Arm sling Ortho Device/Splint Location: URE Ortho Device/Splint Interventions: Adjustment, Application, Ordered   Post Interventions Patient Tolerated: Well Instructions Provided: Care of device, Adjustment of device   [name] 07/14/2019, 11:30 AM

## 2019-07-15 ENCOUNTER — Encounter (HOSPITAL_COMMUNITY): Payer: Self-pay | Admitting: General Practice

## 2019-07-15 DIAGNOSIS — S52571A Other intraarticular fracture of lower end of right radius, initial encounter for closed fracture: Secondary | ICD-10-CM | POA: Diagnosis not present

## 2019-07-15 LAB — HIV ANTIBODY (ROUTINE TESTING W REFLEX): HIV Screen 4th Generation wRfx: NONREACTIVE

## 2019-07-15 NOTE — Progress Notes (Signed)
   Ortho Hand Progress Note  Subjective: No acute events last night. Pain controlled.    Objective: Vital signs in last 24 hours: Temp:  [97.6 F (36.4 C)-98.7 F (37.1 C)] 98.2 F (36.8 C) (08/18 0513) Pulse Rate:  [69-113] 85 (08/18 0513) Resp:  [15-29] 16 (08/18 0014) BP: (96-131)/(57-82) 116/64 (08/18 0513) SpO2:  [94 %-100 %] 98 % (08/18 0513)  Intake/Output from previous day: 08/17 0701 - 08/18 0700 In: 1460 [P.O.:360; I.V.:1000; IV Piggyback:100] Out: 8 [Urine:3; Blood:5] Intake/Output this shift: No intake/output data recorded.  Recent Labs    07/14/19 0715  HGB 15.8   No results for input(s): WBC, RBC, HCT, PLT in the last 72 hours. No results for input(s): NA, K, CL, CO2, BUN, CREATININE, GLUCOSE, CALCIUM in the last 72 hours. No results for input(s): LABPT, INR in the last 72 hours.  Aaox3, nad resp nonlabored rrr RUE: splint in place. Minimal swelling to the digits. Intact motor throughout hand. SILT m/u/r. Fingers wwp with bcr.  Assessment/Plan: 38 yo M s/p ORIF right distal radius fracture - continue splint. Keep clean and dry - PO pain meds - Regular diet - Elevate the RUE to prevent pain and swelling. - d/c home with follow up in 10-14 days   Avanell Shackleton III 07/15/2019, 7:27 AM  [phone]

## 2019-07-15 NOTE — Progress Notes (Signed)
Pt educated on correct use of sling, position of elbow, and wrist and thumb, also to keep elevated as much as possible.   Pt d/c'd via wheelchair with belongings, ride at the main entrance at this time.           Escorted by unit NT.

## 2019-07-15 NOTE — Discharge Summary (Signed)
Patient ID: Angel Yates MRN: [id number] DOB/AGE: 37-Jan-1982 37 y.o.  Admit date: 07/14/2019 Discharge date: 07/15/2019  Admission Diagnoses: Right distal radius fracture History reviewed. No pertinent past medical history.  Discharge Diagnoses:  Active Problems:   Closed fracture of right distal radius   Surgeries: Procedure(s): Right distal radius open reduction, internal fixation and surgery as indicated on 07/14/2019    Consultants: PT/OT  Discharged Condition: Improved  Hospital Course: Angel Yates is an 38 y.o. male who was admitted 07/14/2019 with a chief complaint of right wrist pain, and found to have a diagnosis of Right distal radius fracture.  They were brought to the operating room on 07/14/2019 and underwent Procedure(s): Right distal radius open reduction, internal fixation and surgery as indicated.    They were given perioperative antibiotics:  Anti-infectives (From admission, onward)   Start     Dose/Rate Route Frequency Ordered Stop   07/14/19 0700  ceFAZolin (ANCEF) IVPB 2g/100 mL premix     2 g 200 mL/hr over 30 Minutes Intravenous On call to O.R. 07/14/19 [id number] 07/14/19 [id number]    .  They were given sequential compression devices, early ambulation for DVT prophylaxis.  Recent vital signs:  Patient Vitals for the past 24 hrs:  BP Temp Temp src Pulse Resp SpO2  07/15/19 0513 116/64 98.2 F (36.8 C) Oral 85 - 98 %  07/15/19 0014 115/69 98.4 F (36.9 C) Oral 96 16 97 %  07/14/19 1946 128/73 98.7 F (37.1 C) Oral (!) 113 18 98 %  07/14/19 1508 110/63 98.4 F (36.9 C) Oral 82 16 98 %  07/14/19 1230 114/77 97.6 F (36.4 C) Oral 70 16 99 %  07/14/19 1200 103/73 - - 76 - 99 %  07/14/19 1145 (!) 98/58 - - 82 (!) 24 97 %  07/14/19 1112 96/60 - - 71 (!) 24 94 %  07/14/19 1100 (!) 100/57 - - 79 (!) 29 95 %  07/14/19 1045 103/64 - - 79 15 94 %  07/14/19 1035 - - - 82 18 94 %  07/14/19 1030 111/64 97.8 F (36.6 C) - 84 18 97 %  07/14/19 0855 121/82 -  - 77 18 98 %  07/14/19 0850 121/74 - - 80 (!) 24 97 %  07/14/19 0845 129/78 - - 83 (!) 27 99 %  07/14/19 0840 131/70 - - 76 18 100 %  07/14/19 0835 126/78 - - 69 20 100 %  .  Recent laboratory studies: No results found.  Discharge Medications:   Allergies as of 07/15/2019   No Known Allergies     Medication List    TAKE these medications   HYDROcodone-acetaminophen 5-325 MG tablet Commonly known as: NORCO/VICODIN Take 1-2 tablets by mouth every 6 (six) hours as needed. What changed: how much to take       Diagnostic Studies: Dg Forearm Right  Result Date: 07/05/2019 CLINICAL DATA:  Fall with right wrist pain.  Initial encounter. EXAM: RIGHT FOREARM - 2 VIEW COMPARISON:  None. FINDINGS: The entire forearm is in a splint/cast. There is a comminuted and mildly displaced fracture of the distal radius that extends into the radiocarpal joint. No proximal ulnar or radial fractures are identified. Alignment at the elbow appears intact. IMPRESSION: Comminuted fracture of the distal right radius demonstrating mild displacement. Electronically Signed   By: Irish LackGlenn  Yamagata M.D.   On: 07/05/2019 12:23   Dg Wrist Complete Right  Result Date: 07/05/2019 CLINICAL DATA:  Recent fall with wrist pain. EXAM: RIGHT WRIST -  COMPLETE 3+ VIEW COMPARISON:  None. FINDINGS: The forearm is splinted. There is a comminuted and displaced intra-articular fracture of the distal radius. This fracture is associated with up to 4 mm of distal articular surface offset and up to 4 mm of posterior displacement. The fracture extends into the distal radioulnar joint. The distal ulna appears intact. No carpal bone fractures are identified. There is an old fracture of distal 5th metacarpal. IMPRESSION: Comminuted and mildly displaced intra-articular fracture of the distal radius as described. Electronically Signed   By: Richardean Sale M.D.   On: 07/05/2019 12:22    They benefited maximally from their hospital stay and there  were no complications.     Disposition: Discharge disposition: 01-Home or Self Care      Discharge Instructions    Call MD / Call 911   Complete by: As directed    If you experience chest pain or shortness of breath, CALL 911 and be transported to the hospital emergency room.  If you develope a fever above 101 F, pus (white drainage) or increased drainage or redness at the wound, or calf pain, call your surgeon's office.   Call MD / Call 911   Complete by: As directed    If you experience chest pain or shortness of breath, CALL 911 and be transported to the hospital emergency room.  If you develope a fever above 101 F, pus (white drainage) or increased drainage or redness at the wound, or calf pain, call your surgeon's office.   Constipation Prevention   Complete by: As directed    Drink plenty of fluids.  Prune juice may be helpful.  You may use a stool softener, such as Colace (over the counter) 100 mg twice a day.  Use MiraLax (over the counter) for constipation as needed.   Constipation Prevention   Complete by: As directed    Drink plenty of fluids.  Prune juice may be helpful.  You may use a stool softener, such as Colace (over the counter) 100 mg twice a day.  Use MiraLax (over the counter) for constipation as needed.   Diet - low sodium heart healthy   Complete by: As directed    Increase activity slowly as tolerated   Complete by: As directed    Increase activity slowly as tolerated   Complete by: As directed      Follow-up Information    Avanell Shackleton III, MD. Schedule an appointment as soon as possible for a visit in 2 weeks.   Why: For suture removal Contact information: [address] [place] [address] [phone]            Signed: Verner Mould, MD  07/15/2019, 7:31 AM

## 2019-07-15 NOTE — Progress Notes (Signed)
Discharge instructions reviewed with pt by use of interpreter.  Copy of instructions given to pt.(in [place] and [place]) pt informed his script for pain medication was at his pharmacy.  Pt waiting for his ride to get off work around [place].   \

## 2019-12-03 ENCOUNTER — Other Ambulatory Visit: Payer: Self-pay | Admitting: Orthopaedic Surgery

## 2019-12-03 DIAGNOSIS — S52531A Colles' fracture of right radius, initial encounter for closed fracture: Secondary | ICD-10-CM

## 2019-12-08 ENCOUNTER — Other Ambulatory Visit: Payer: Self-pay

## 2020-09-10 IMAGING — CR RIGHT WRIST - COMPLETE 3+ VIEW
4 series · 4 of 4 positions shown · non-contrast
Comparison: None.

CLINICAL DATA: Recent fall with wrist pain.

EXAM:
RIGHT WRIST - COMPLETE 3+ VIEW

[wrist pa]
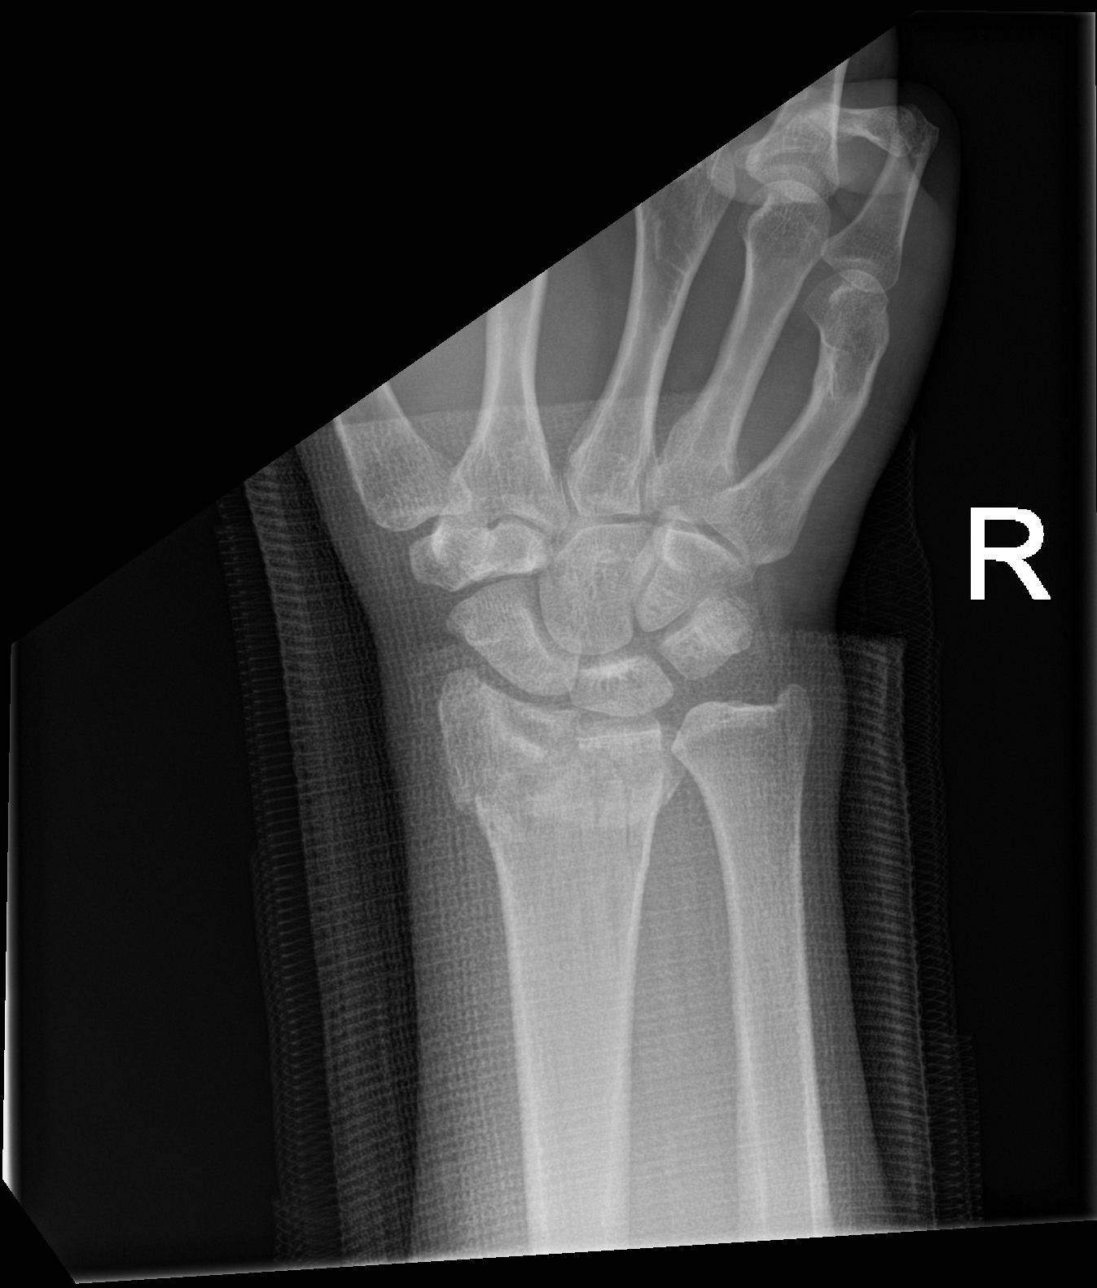

[wrist obl]
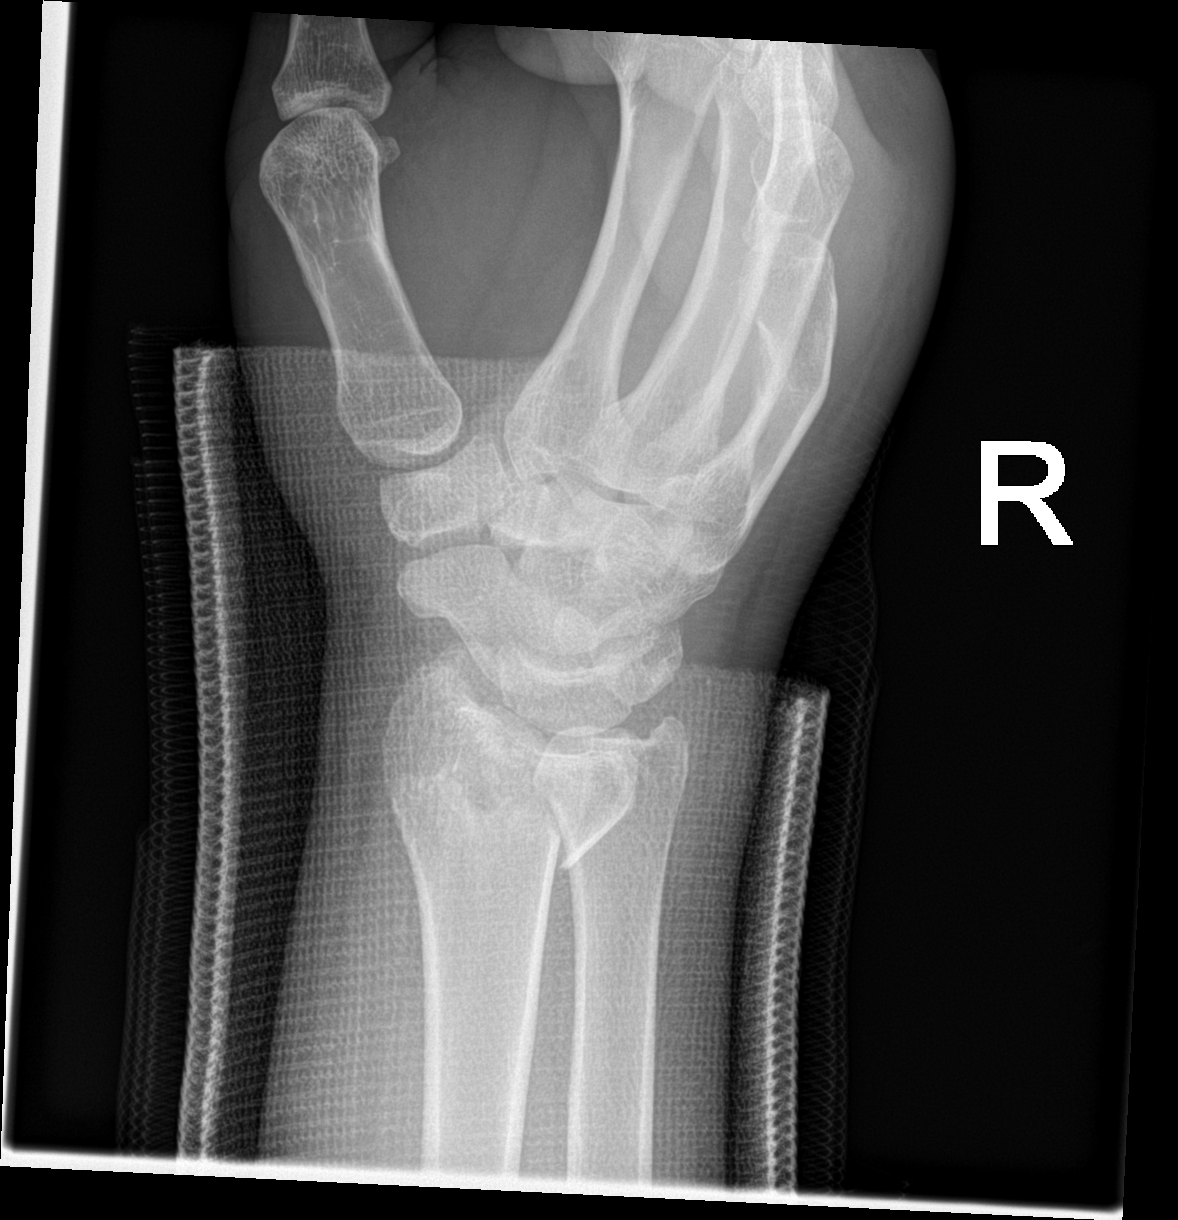

[wrist lat]
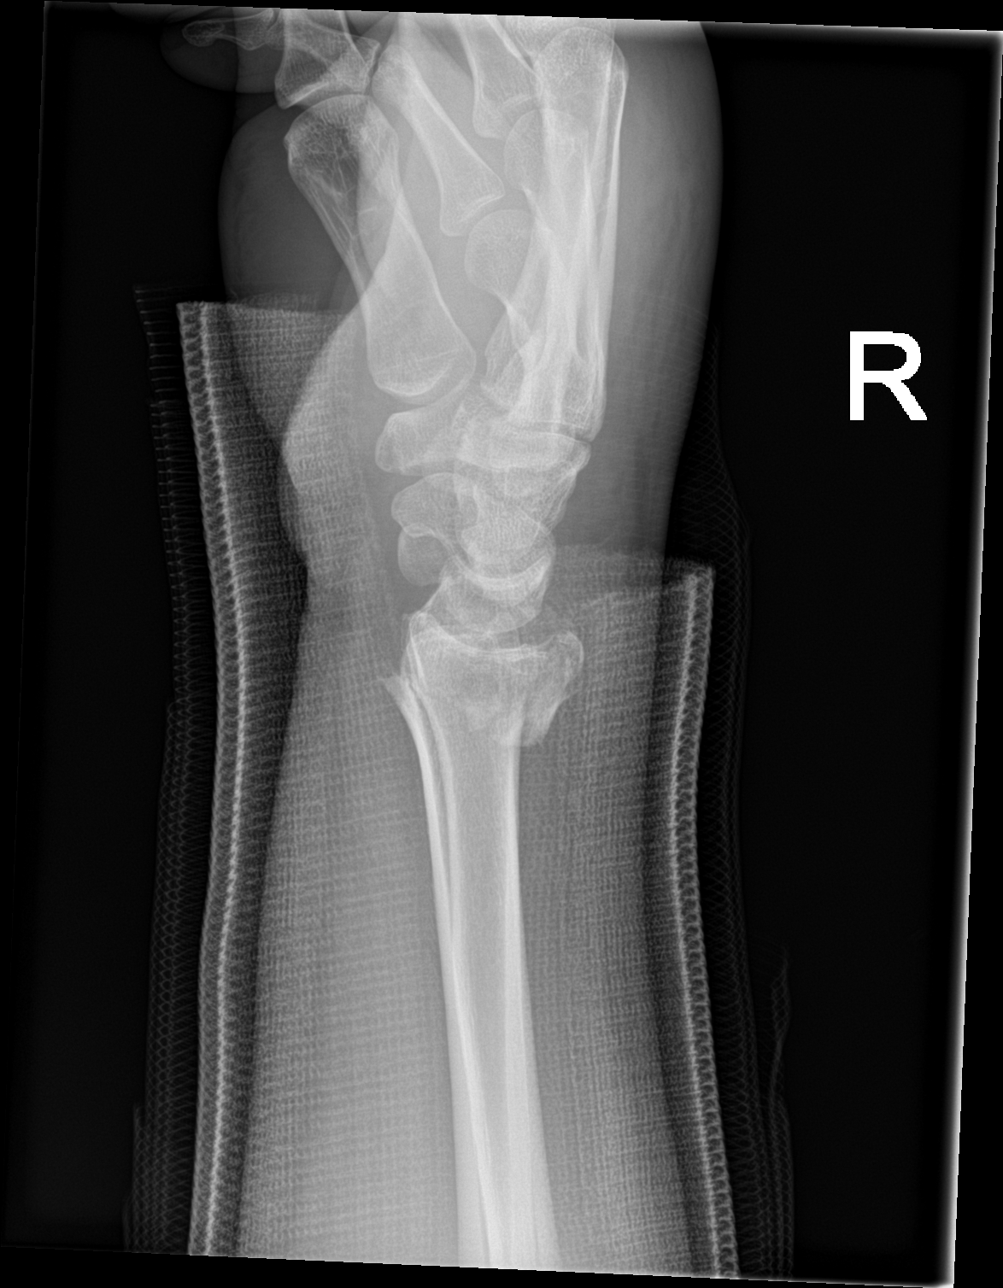

[wrist navicular]
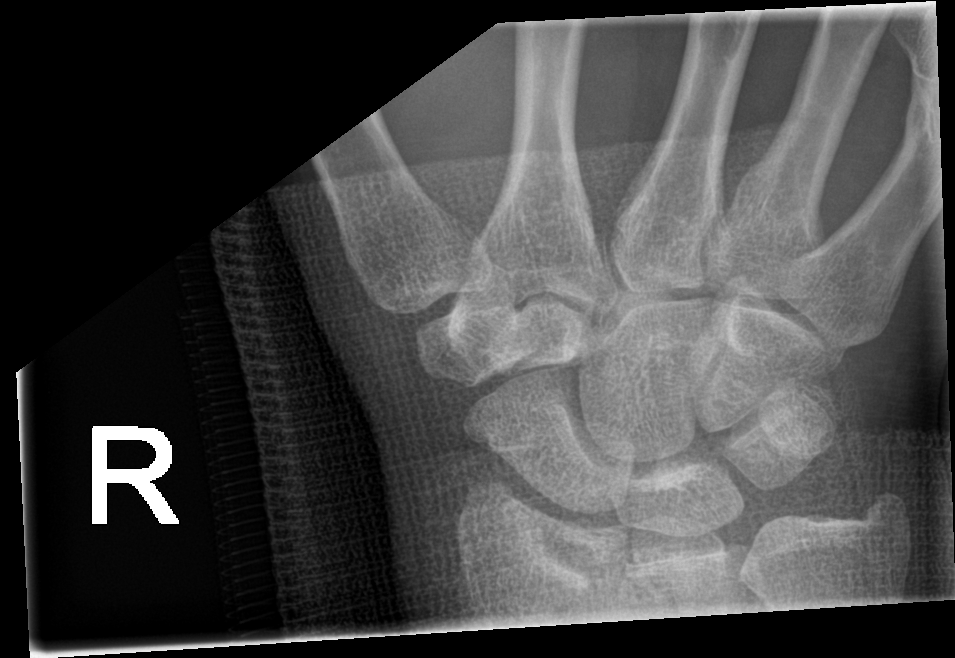

[4 of 4 positions shown; findings below may reference images not displayed]

FINDINGS: The forearm is splinted. There is a comminuted and displaced
intra-articular fracture of the distal radius. This fracture is
associated with up to 4 mm of distal articular surface offset and up
to 4 mm of posterior displacement. The fracture extends into the
distal radioulnar joint. The distal ulna appears intact. No carpal
bone fractures are identified. There is an old fracture of distal
5th metacarpal.
IMPRESSION: Comminuted and mildly displaced intra-articular fracture of the
distal radius as described.

## 2021-10-07 ENCOUNTER — Emergency Department (HOSPITAL_COMMUNITY): Payer: Self-pay

## 2021-10-07 ENCOUNTER — Encounter (HOSPITAL_COMMUNITY): Payer: Self-pay

## 2021-10-07 ENCOUNTER — Ambulatory Visit (HOSPITAL_COMMUNITY)
Admission: EM | Admit: 2021-10-07 | Discharge: 2021-10-07 | Disposition: A | Discharge status: Home / Self Care | Payer: Self-pay | Attending: Urgent Care | Admitting: Urgent Care

## 2021-10-07 ENCOUNTER — Other Ambulatory Visit: Payer: Self-pay

## 2021-10-07 ENCOUNTER — Observation Stay (HOSPITAL_COMMUNITY)
Admission: EM | Admit: 2021-10-07 | Discharge: 2021-10-08 | Disposition: A | Discharge status: Home / Self Care | Payer: Self-pay | Attending: Surgery | Admitting: Surgery

## 2021-10-07 DIAGNOSIS — K3580 Unspecified acute appendicitis: Secondary | ICD-10-CM | POA: Diagnosis present

## 2021-10-07 DIAGNOSIS — Z20822 Contact with and (suspected) exposure to covid-19: Secondary | ICD-10-CM | POA: Insufficient documentation

## 2021-10-07 DIAGNOSIS — R319 Hematuria, unspecified: Secondary | ICD-10-CM

## 2021-10-07 DIAGNOSIS — R109 Unspecified abdominal pain: Secondary | ICD-10-CM

## 2021-10-07 DIAGNOSIS — K3531 Acute appendicitis with localized peritonitis and gangrene, without perforation: Principal | ICD-10-CM | POA: Insufficient documentation

## 2021-10-07 DIAGNOSIS — K353 Acute appendicitis with localized peritonitis, without perforation or gangrene: Secondary | ICD-10-CM

## 2021-10-07 DIAGNOSIS — A4150 Gram-negative sepsis, unspecified: Secondary | ICD-10-CM | POA: Insufficient documentation

## 2021-10-07 LAB — POCT URINALYSIS DIPSTICK, ED / UC
Glucose, UA: NEGATIVE mg/dL
Ketones, ur: >=160 mg/dL — AB
Leukocytes,Ua: NEGATIVE
Nitrite: NEGATIVE
Protein, ur: 30 mg/dL — AB
Specific Gravity, Urine: >=1.03 (ref 1.005–1.030)
Urobilinogen, UA: 0.2 mg/dL (ref 0.0–1.0)
pH: 6 (ref 5.0–8.0)

## 2021-10-07 LAB — CBC WITH DIFFERENTIAL/PLATELET
Abs Immature Granulocytes: 0.2 10*3/uL — ABNORMAL HIGH (ref 0.00–0.07)
Basophils Absolute: 0.1 10*3/uL (ref 0.0–0.1)
Basophils Relative: 0 %
Eosinophils Absolute: 0 10*3/uL (ref 0.0–0.5)
Eosinophils Relative: 0 %
HCT: 46.7 % (ref 39.0–52.0)
Hemoglobin: 16.6 g/dL (ref 13.0–17.0)
Immature Granulocytes: 1 %
Lymphocytes Relative: 7 %
Lymphs Abs: 1.9 10*3/uL (ref 0.7–4.0)
MCH: 30.6 pg (ref 26.0–34.0)
MCHC: 35.5 g/dL (ref 30.0–36.0)
MCV: 86.2 fL (ref 80.0–100.0)
Monocytes Absolute: 2.1 10*3/uL — ABNORMAL HIGH (ref 0.1–1.0)
Monocytes Relative: 7 %
Neutro Abs: 23.5 10*3/uL — ABNORMAL HIGH (ref 1.7–7.7)
Neutrophils Relative %: 85 %
Platelets: 361 10*3/uL (ref 150–400)
RBC: 5.42 MIL/uL (ref 4.22–5.81)
RDW: 12.1 % (ref 11.5–15.5)
WBC: 27.7 10*3/uL — ABNORMAL HIGH (ref 4.0–10.5)
nRBC: 0 % (ref 0.0–0.2)

## 2021-10-07 LAB — COMPREHENSIVE METABOLIC PANEL
ALT: 66 U/L — ABNORMAL HIGH (ref 0–44)
AST: 31 U/L (ref 15–41)
Albumin: 4.5 g/dL (ref 3.5–5.0)
Alkaline Phosphatase: 88 U/L (ref 38–126)
Anion gap: 14 (ref 5–15)
BUN: 16 mg/dL (ref 6–20)
CO2: 22 mmol/L (ref 22–32)
Calcium: 9.3 mg/dL (ref 8.9–10.3)
Chloride: 98 mmol/L (ref 98–111)
Creatinine, Ser: 1.18 mg/dL (ref 0.61–1.24)
GFR, Estimated: >60 mL/min (ref >60)
Glucose, Bld: 112 mg/dL — ABNORMAL HIGH (ref 70–99)
Potassium: 4.1 mmol/L (ref 3.5–5.1)
Sodium: 134 mmol/L — ABNORMAL LOW (ref 135–145)
Total Bilirubin: 1.9 mg/dL — ABNORMAL HIGH (ref 0.3–1.2)
Total Protein: 8.2 g/dL — ABNORMAL HIGH (ref 6.5–8.1)

## 2021-10-07 LAB — CK: Total CK: 204 U/L (ref 49–397)

## 2021-10-07 MED ORDER — ACETAMINOPHEN 325 MG PO TABS
650.0000 mg | ORAL_TABLET | Freq: Once | ORAL | Status: AC
  Administered 2021-10-08: 650 mg via ORAL
  Filled 2021-10-07: qty 2

## 2021-10-07 MED ORDER — TAMSULOSIN HCL 0.4 MG PO CAPS: 0.4000 mg | ORAL_CAPSULE | Freq: Every day | ORAL | 0 refills | Status: DC

## 2021-10-07 MED ORDER — KETOROLAC TROMETHAMINE 60 MG/2ML IM SOLN
60.0000 mg | Freq: Once | INTRAMUSCULAR | Status: AC
  Administered 2021-10-07: 60 mg via INTRAMUSCULAR

## 2021-10-07 MED ORDER — KETOROLAC TROMETHAMINE 60 MG/2ML IM SOLN
INTRAMUSCULAR | Status: AC
  Filled 2021-10-07: qty 2

## 2021-10-07 NOTE — ED Provider Notes (Signed)
Emergency Medicine Provider Triage Evaluation Note  Angel Yates , a 40 y.o. male  was evaluated in triage.  Pt complains of right-sided flank/right lower quadrant abdominal pain that began yesterday.  He believes he ate something that caused him to feel sick.  He denies any nausea, vomiting, diarrhea, urinary symptoms.  He went to urgent care and was sent here for further evaluation.  With urgent care he had a UA done which did show some small hemoglobin.  He had a CBC done with a significantly elevated leukocytosis of 27,000.  He was provided with a Toradol injection and advised to come to the ED for further evaluation.  He was not febrile at urgent care however here he is febrile at 1-2.0.  He was unaware he had a fever.  He is also noted to be tachycardic.  He denies any previous surgeries to his abdomen.  Review of Systems  Positive: + abd pain Negative: - nausea, vomiting, diarrhea, urinary symptoms  Physical Exam  BP (!) 149/99 (BP Location: Right Arm)   Pulse (!) 119   Temp (!) 102 F (38.9 C) (Oral)   Resp 18   Ht 6' (1.829 m)   Wt 90.7 kg   SpO2 98%   BMI 27.12 kg/m  Gen:   Awake, no distress   Resp:  Normal effort  MSK:   Moves extremities without difficulty  Other:  + right flank/RLQ abdominal TTP. No rebound or guarding.   Medical Decision Making  Medically screening exam initiated at 3:55 PM.  Appropriate orders placed.  Pardeep Pautz was informed that the remainder of the evaluation will be completed by another provider, this initial triage assessment does not replace that evaluation, and the importance of remaining in the ED until their evaluation is complete.  Labs done at urgent care.  Patient in CT scan for further evaluation.  Tylenol provided for fever.   Tanda Rockers, PA-C 10/07/21 1558    Arby Barrette, MD 10/08/21 [phone]

## 2021-10-07 NOTE — ED Triage Notes (Signed)
Pt presents with abdominal pain. States he ate yesterday and states after 5pm he started to have pain on the R side of his abdomen. States the food might have been expired.

## 2021-10-07 NOTE — ED Provider Notes (Signed)
[place]   MRN: [id number] DOB: 1980/12/23  Subjective:   Angel Yates is a 40 y.o. male presenting for 1 day history of acute onset persistent moderate to severe right-sided flank pain.  Patient states symptoms started after he came home and ate leftover food.  Denies fever, nausea, vomiting, dysuria, urinary frequency, hematuria, constipation, bloody stools, diarrhea.  No history of renal stones, GI issues. No chest pain, shob.   No current facility-administered medications for this encounter.  Current Outpatient Medications:    HYDROcodone-acetaminophen (NORCO/VICODIN) 5-325 MG tablet, Take 1-2 tablets by mouth every 6 (six) hours as needed., Disp: 35 tablet, Rfl: 0   No Known Allergies  History reviewed. No pertinent past medical history.   Past Surgical History:  Procedure Laterality Date   ORIF RADIAL FRACTURE Right 07/14/2019   ORIF WRIST FRACTURE Right 07/14/2019   Procedure: Right distal radius open reduction, internal fixation and surgery as indicated;  Surgeon: Verner Mould, MD;  Location: [facility];  Service: Orthopedics;  Laterality: Right;  3min    History reviewed. No pertinent family history.  Social History   Tobacco Use   Smoking status: Never   Smokeless tobacco: Never  Vaping Use   Vaping Use: Never used  Substance Use Topics   Alcohol use: Never   Drug use: Never    ROS   Objective:   Vitals: BP 115/77 (BP Location: Left Arm)   Pulse 84   Temp 98 F (36.7 C) (Oral)   Resp 17   SpO2 97%   Physical Exam Constitutional:      General: He is not in acute distress.    Appearance: Normal appearance. He is well-developed and normal weight. He is not ill-appearing, toxic-appearing or diaphoretic.  HENT:     Head: Normocephalic and atraumatic.     Right Ear: External ear normal.     Left Ear: External ear normal.     Nose: Nose normal.     Mouth/Throat:     Mouth: Mucous membranes are moist.     Pharynx:  Oropharynx is clear.  Eyes:     General: No scleral icterus.       Right eye: No discharge.        Left eye: No discharge.     Extraocular Movements: Extraocular movements intact.     Pupils: Pupils are equal, round, and reactive to light.  Cardiovascular:     Rate and Rhythm: Normal rate and regular rhythm.     Heart sounds: Normal heart sounds. No murmur heard.   No friction rub. No gallop.  Pulmonary:     Effort: Pulmonary effort is normal. No respiratory distress.     Breath sounds: Normal breath sounds. No stridor. No wheezing, rhonchi or rales.  Abdominal:     General: Bowel sounds are normal. There is no distension.     Palpations: Abdomen is soft. There is no mass.     Tenderness: There is abdominal tenderness (right flank side worse over mid area between RUQ, RLQ). There is no right CVA tenderness, left CVA tenderness, guarding or rebound.  Musculoskeletal:     Cervical back: Normal range of motion.  Neurological:     Mental Status: He is alert and oriented to person, place, and time.  Psychiatric:        Mood and Affect: Mood normal.        Behavior: Behavior normal.        Thought Content: Thought content  normal.        Judgment: Judgment normal.    Results for orders placed or performed during the hospital encounter of 10/07/21 (from the past 24 hour(s))  POC Urinalysis dipstick     Status: Abnormal   Collection Time: 10/07/21 11:05 AM  Result Value Ref Range   Glucose, UA NEGATIVE NEGATIVE mg/dL   Bilirubin Urine SMALL (A) NEGATIVE   Ketones, ur >=160 (A) NEGATIVE mg/dL   Specific Gravity, Urine >=1.030 1.005 - 1.030   Hgb urine dipstick MODERATE (A) NEGATIVE   pH 6.0 5.0 - 8.0   Protein, ur 30 (A) NEGATIVE mg/dL   Urobilinogen, UA 0.2 0.0 - 1.0 mg/dL   Nitrite NEGATIVE NEGATIVE   Leukocytes,Ua NEGATIVE NEGATIVE    Assessment and Plan :   PDMP not reviewed this encounter.  1. Right flank pain   2. Hematuria, unspecified type    High suspicion for renal  colic.  I do not suspect that he has an obstructive uropathy at this point for an acute abdomen but discussed this possibility including diverticulitis.  As he has no fever, bloody stools and is able to urinate still, recommended outpatient management with aggressive hydration, tamsulosin, pain control.  Labs pending, will redirect to the emergency room as appropriate.  Deferred x-ray imaging as CT is the preferred method for obstructive uropathy which I do not suspect at this time. Counseled patient on potential for adverse effects with medications prescribed/recommended today, ER and return-to-clinic precautions discussed, patient verbalized understanding.    Wallis Bamberg, PA-C 10/07/21 1137

## 2021-10-07 NOTE — ED Triage Notes (Signed)
Patient stated stated he ate something yesterday that made him sick, denies nausea / vomiting / diarrhea. C/O headache as well, denies any health history.

## 2021-10-08 ENCOUNTER — Encounter (HOSPITAL_COMMUNITY): Payer: Self-pay | Admitting: Emergency Medicine

## 2021-10-08 ENCOUNTER — Observation Stay (HOSPITAL_COMMUNITY): Payer: Self-pay | Admitting: Certified Registered"

## 2021-10-08 ENCOUNTER — Observation Stay (HOSPITAL_COMMUNITY)
Admission: EM | Admit: 2021-10-08 | Discharge: 2021-10-10 | Disposition: A | Discharge status: Home / Self Care | Payer: Self-pay | Attending: Emergency Medicine | Admitting: Emergency Medicine

## 2021-10-08 ENCOUNTER — Encounter (HOSPITAL_COMMUNITY)
Admission: EM | Disposition: A | Discharge status: Home / Self Care | Payer: Self-pay | Source: Home / Self Care | Attending: Emergency Medicine

## 2021-10-08 ENCOUNTER — Encounter (HOSPITAL_COMMUNITY): Payer: Self-pay

## 2021-10-08 ENCOUNTER — Other Ambulatory Visit: Payer: Self-pay

## 2021-10-08 ENCOUNTER — Telehealth (HOSPITAL_BASED_OUTPATIENT_CLINIC_OR_DEPARTMENT_OTHER): Payer: Self-pay | Admitting: Emergency Medicine

## 2021-10-08 DIAGNOSIS — R7401 Elevation of levels of liver transaminase levels: Secondary | ICD-10-CM | POA: Insufficient documentation

## 2021-10-08 DIAGNOSIS — R7881 Bacteremia: Principal | ICD-10-CM | POA: Diagnosis present

## 2021-10-08 DIAGNOSIS — N289 Disorder of kidney and ureter, unspecified: Secondary | ICD-10-CM | POA: Insufficient documentation

## 2021-10-08 DIAGNOSIS — K3580 Unspecified acute appendicitis: Secondary | ICD-10-CM | POA: Diagnosis present

## 2021-10-08 DIAGNOSIS — Z20822 Contact with and (suspected) exposure to covid-19: Secondary | ICD-10-CM | POA: Insufficient documentation

## 2021-10-08 DIAGNOSIS — Z79899 Other long term (current) drug therapy: Secondary | ICD-10-CM | POA: Insufficient documentation

## 2021-10-08 DIAGNOSIS — Z9889 Other specified postprocedural states: Secondary | ICD-10-CM | POA: Insufficient documentation

## 2021-10-08 HISTORY — PX: LAPAROSCOPIC APPENDECTOMY: SHX408

## 2021-10-08 HISTORY — DX: Other specified health status: Z78.9

## 2021-10-08 LAB — BLOOD CULTURE ID PANEL (REFLEXED) - BCID2

## 2021-10-08 LAB — CBC WITH DIFFERENTIAL/PLATELET
Abs Immature Granulocytes: 0.15 10*3/uL — ABNORMAL HIGH (ref 0.00–0.07)
Abs Immature Granulocytes: 0.09 10*3/uL — ABNORMAL HIGH (ref 0.00–0.07)
Basophils Absolute: 0.1 10*3/uL (ref 0.0–0.1)
Basophils Absolute: 0 10*3/uL (ref 0.0–0.1)
Basophils Relative: 0 %
Basophils Relative: 0 %
Eosinophils Absolute: 0 10*3/uL (ref 0.0–0.5)
Eosinophils Absolute: 0.1 10*3/uL (ref 0.0–0.5)
Eosinophils Relative: 0 %
Eosinophils Relative: 1 %
HCT: 44.4 % (ref 39.0–52.0)
HCT: 43.5 % (ref 39.0–52.0)
Hemoglobin: 15.4 g/dL (ref 13.0–17.0)
Hemoglobin: 14.4 g/dL (ref 13.0–17.0)
Immature Granulocytes: 1 %
Immature Granulocytes: 1 %
Lymphocytes Relative: 3 %
Lymphocytes Relative: 5 %
Lymphs Abs: 0.8 10*3/uL (ref 0.7–4.0)
Lymphs Abs: 1 10*3/uL (ref 0.7–4.0)
MCH: 30.5 pg (ref 26.0–34.0)
MCH: 30.1 pg (ref 26.0–34.0)
MCHC: 34.7 g/dL (ref 30.0–36.0)
MCHC: 33.1 g/dL (ref 30.0–36.0)
MCV: 87.9 fL (ref 80.0–100.0)
MCV: 90.8 fL (ref 80.0–100.0)
Monocytes Absolute: 1.2 10*3/uL — ABNORMAL HIGH (ref 0.1–1.0)
Monocytes Absolute: 1 10*3/uL (ref 0.1–1.0)
Monocytes Relative: 5 %
Monocytes Relative: 5 %
Neutro Abs: 22.6 10*3/uL — ABNORMAL HIGH (ref 1.7–7.7)
Neutro Abs: 17.4 10*3/uL — ABNORMAL HIGH (ref 1.7–7.7)
Neutrophils Relative %: 91 %
Neutrophils Relative %: 88 %
Platelets: 260 10*3/uL (ref 150–400)
Platelets: 249 10*3/uL (ref 150–400)
RBC: 5.05 MIL/uL (ref 4.22–5.81)
RBC: 4.79 MIL/uL (ref 4.22–5.81)
RDW: 12.6 % (ref 11.5–15.5)
RDW: 12.9 % (ref 11.5–15.5)
WBC: 24.9 10*3/uL — ABNORMAL HIGH (ref 4.0–10.5)
WBC: 19.6 10*3/uL — ABNORMAL HIGH (ref 4.0–10.5)
nRBC: 0 % (ref 0.0–0.2)
nRBC: 0 % (ref 0.0–0.2)

## 2021-10-08 LAB — BASIC METABOLIC PANEL
Anion gap: 11 (ref 5–15)
BUN: 19 mg/dL (ref 6–20)
CO2: 22 mmol/L (ref 22–32)
Calcium: 9.1 mg/dL (ref 8.9–10.3)
Chloride: 100 mmol/L (ref 98–111)
Creatinine, Ser: 1.51 mg/dL — ABNORMAL HIGH (ref 0.61–1.24)
GFR, Estimated: 60 mL/min — ABNORMAL LOW (ref >60)
Glucose, Bld: 118 mg/dL — ABNORMAL HIGH (ref 70–99)
Potassium: 3.6 mmol/L (ref 3.5–5.1)
Sodium: 133 mmol/L — ABNORMAL LOW (ref 135–145)

## 2021-10-08 LAB — COMPREHENSIVE METABOLIC PANEL
ALT: 65 U/L — ABNORMAL HIGH (ref 0–44)
AST: 37 U/L (ref 15–41)
Albumin: 3.5 g/dL (ref 3.5–5.0)
Alkaline Phosphatase: 76 U/L (ref 38–126)
Anion gap: 10 (ref 5–15)
BUN: 15 mg/dL (ref 6–20)
CO2: 25 mmol/L (ref 22–32)
Calcium: 8.6 mg/dL — ABNORMAL LOW (ref 8.9–10.3)
Chloride: 102 mmol/L (ref 98–111)
Creatinine, Ser: 1.42 mg/dL — ABNORMAL HIGH (ref 0.61–1.24)
GFR, Estimated: >60 mL/min (ref >60)
Glucose, Bld: 140 mg/dL — ABNORMAL HIGH (ref 70–99)
Potassium: 4.1 mmol/L (ref 3.5–5.1)
Sodium: 137 mmol/L (ref 135–145)
Total Bilirubin: 1.1 mg/dL (ref 0.3–1.2)
Total Protein: 7.3 g/dL (ref 6.5–8.1)

## 2021-10-08 LAB — URINALYSIS, ROUTINE W REFLEX MICROSCOPIC
Bacteria, UA: NONE SEEN
Bilirubin Urine: NEGATIVE
Glucose, UA: NEGATIVE mg/dL
Ketones, ur: 5 mg/dL — AB
Leukocytes,Ua: NEGATIVE
Nitrite: NEGATIVE
Protein, ur: NEGATIVE mg/dL
Specific Gravity, Urine: 1.017 (ref 1.005–1.030)
pH: 5 (ref 5.0–8.0)

## 2021-10-08 LAB — HIV ANTIBODY (ROUTINE TESTING W REFLEX): HIV Screen 4th Generation wRfx: NONREACTIVE

## 2021-10-08 LAB — RESP PANEL BY RT-PCR (FLU A&B, COVID) ARPGX2
Influenza A by PCR: NEGATIVE
Influenza B by PCR: NEGATIVE
SARS Coronavirus 2 by RT PCR: NEGATIVE

## 2021-10-08 LAB — LACTIC ACID, PLASMA: Lactic Acid, Venous: 1.6 mmol/L (ref 0.5–1.9)

## 2021-10-08 SURGERY — APPENDECTOMY, LAPAROSCOPIC
Anesthesia: General

## 2021-10-08 MED ORDER — LACTATED RINGERS IV SOLN: INTRAVENOUS | Status: DC

## 2021-10-08 MED ORDER — INFLUENZA VAC SPLIT QUAD 0.5 ML IM SUSY: 0.5000 mL | PREFILLED_SYRINGE | INTRAMUSCULAR | Status: DC

## 2021-10-08 MED ORDER — LIDOCAINE 2% (20 MG/ML) 5 ML SYRINGE
INTRAMUSCULAR | Status: DC | PRN
  Administered 2021-10-08: 60 mg via INTRAVENOUS

## 2021-10-08 MED ORDER — OXYCODONE HCL 5 MG PO TABS: 5.0000 mg | ORAL_TABLET | Freq: Once | ORAL | Status: DC | PRN

## 2021-10-08 MED ORDER — FENTANYL CITRATE (PF) 100 MCG/2ML IJ SOLN
INTRAMUSCULAR | Status: DC | PRN
  Administered 2021-10-08: 150 ug via INTRAVENOUS

## 2021-10-08 MED ORDER — SODIUM CHLORIDE 0.9 % IV SOLN: 2.0000 g | INTRAVENOUS | Status: DC

## 2021-10-08 MED ORDER — PROPOFOL 10 MG/ML IV BOLUS
INTRAVENOUS | Status: DC | PRN
  Administered 2021-10-08: 200 mg via INTRAVENOUS

## 2021-10-08 MED ORDER — MORPHINE SULFATE (PF) 4 MG/ML IV SOLN
4.0000 mg | INTRAVENOUS | Status: DC | PRN
  Administered 2021-10-08 (×2): 4 mg via INTRAVENOUS
  Filled 2021-10-08 (×2): qty 1

## 2021-10-08 MED ORDER — ORAL CARE MOUTH RINSE: 15.0000 mL | Freq: Once | OROMUCOSAL | Status: AC

## 2021-10-08 MED ORDER — DIPHENHYDRAMINE HCL 50 MG/ML IJ SOLN: 25.0000 mg | Freq: Four times a day (QID) | INTRAMUSCULAR | Status: DC | PRN

## 2021-10-08 MED ORDER — BUPIVACAINE-EPINEPHRINE 0.25% -1:200000 IJ SOLN
INTRAMUSCULAR | Status: DC | PRN
  Administered 2021-10-08: 30 mL

## 2021-10-08 MED ORDER — MIDAZOLAM HCL 2 MG/2ML IJ SOLN
INTRAMUSCULAR | Status: AC
  Filled 2021-10-08: qty 2

## 2021-10-08 MED ORDER — ONDANSETRON HCL 4 MG/2ML IJ SOLN
4.0000 mg | Freq: Four times a day (QID) | INTRAMUSCULAR | Status: DC | PRN
  Administered 2021-10-08: 4 mg via INTRAVENOUS
  Filled 2021-10-08: qty 2

## 2021-10-08 MED ORDER — PHENYLEPHRINE 40 MCG/ML (10ML) SYRINGE FOR IV PUSH (FOR BLOOD PRESSURE SUPPORT)
PREFILLED_SYRINGE | INTRAVENOUS | Status: DC | PRN
  Administered 2021-10-08 (×3): 120 ug via INTRAVENOUS

## 2021-10-08 MED ORDER — ACETAMINOPHEN 10 MG/ML IV SOLN
INTRAVENOUS | Status: AC
  Filled 2021-10-08: qty 100

## 2021-10-08 MED ORDER — ACETAMINOPHEN 500 MG PO TABS
1000.0000 mg | ORAL_TABLET | Freq: Once | ORAL | Status: AC
  Administered 2021-10-08: 1000 mg via ORAL
  Filled 2021-10-08: qty 2

## 2021-10-08 MED ORDER — LACTATED RINGERS IV SOLN: INTRAVENOUS | Status: DC | PRN

## 2021-10-08 MED ORDER — FENTANYL CITRATE (PF) 100 MCG/2ML IJ SOLN
INTRAMUSCULAR | Status: AC
  Filled 2021-10-08: qty 2

## 2021-10-08 MED ORDER — FENTANYL CITRATE (PF) 100 MCG/2ML IJ SOLN
25.0000 ug | INTRAMUSCULAR | Status: DC | PRN
  Administered 2021-10-08: 25 ug via INTRAVENOUS

## 2021-10-08 MED ORDER — DOCUSATE SODIUM 100 MG PO CAPS: 100.0000 mg | ORAL_CAPSULE | Freq: Two times a day (BID) | ORAL | 2 refills | Status: AC

## 2021-10-08 MED ORDER — SODIUM CHLORIDE 0.9 % IR SOLN
Status: DC | PRN
  Administered 2021-10-08: 1000 mL

## 2021-10-08 MED ORDER — SODIUM CHLORIDE 0.9 % IV BOLUS
1000.0000 mL | Freq: Once | INTRAVENOUS | Status: AC
  Administered 2021-10-08: 1000 mL via INTRAVENOUS

## 2021-10-08 MED ORDER — 0.9 % SODIUM CHLORIDE (POUR BTL) OPTIME
TOPICAL | Status: DC | PRN
  Administered 2021-10-08: 1000 mL

## 2021-10-08 MED ORDER — IBUPROFEN 600 MG PO TABS: 600.0000 mg | ORAL_TABLET | Freq: Four times a day (QID) | ORAL | 1 refills | Status: AC

## 2021-10-08 MED ORDER — OXYCODONE HCL 5 MG PO TABS: 5.0000 mg | ORAL_TABLET | ORAL | 0 refills | Status: DC | PRN

## 2021-10-08 MED ORDER — DIPHENHYDRAMINE HCL 25 MG PO CAPS: 25.0000 mg | ORAL_CAPSULE | Freq: Four times a day (QID) | ORAL | Status: DC | PRN

## 2021-10-08 MED ORDER — MORPHINE SULFATE (PF) 4 MG/ML IV SOLN
4.0000 mg | Freq: Once | INTRAVENOUS | Status: AC
  Administered 2021-10-08: 4 mg via INTRAVENOUS
  Filled 2021-10-08: qty 1

## 2021-10-08 MED ORDER — ONDANSETRON HCL 4 MG/2ML IJ SOLN
4.0000 mg | Freq: Once | INTRAMUSCULAR | Status: AC
  Administered 2021-10-08: 4 mg via INTRAVENOUS
  Filled 2021-10-08: qty 2

## 2021-10-08 MED ORDER — ACETAMINOPHEN 500 MG PO TABS: 1000.0000 mg | ORAL_TABLET | Freq: Once | ORAL | Status: DC | PRN

## 2021-10-08 MED ORDER — ONDANSETRON HCL 4 MG/2ML IJ SOLN
INTRAMUSCULAR | Status: DC | PRN
  Administered 2021-10-08: 4 mg via INTRAVENOUS

## 2021-10-08 MED ORDER — PHENYLEPHRINE HCL-NACL 20-0.9 MG/250ML-% IV SOLN
INTRAVENOUS | Status: DC | PRN
  Administered 2021-10-08: 25 ug/min via INTRAVENOUS

## 2021-10-08 MED ORDER — EPHEDRINE SULFATE-NACL 50-0.9 MG/10ML-% IV SOSY
PREFILLED_SYRINGE | INTRAVENOUS | Status: DC | PRN
  Administered 2021-10-08: 10 mg via INTRAVENOUS

## 2021-10-08 MED ORDER — ACETAMINOPHEN 10 MG/ML IV SOLN
1000.0000 mg | Freq: Once | INTRAVENOUS | Status: DC | PRN
  Administered 2021-10-08: 1000 mg via INTRAVENOUS

## 2021-10-08 MED ORDER — METRONIDAZOLE 500 MG/100ML IV SOLN
500.0000 mg | Freq: Once | INTRAVENOUS | Status: AC
  Administered 2021-10-08: 500 mg via INTRAVENOUS
  Filled 2021-10-08: qty 100

## 2021-10-08 MED ORDER — ONDANSETRON 4 MG PO TBDP: 4.0000 mg | ORAL_TABLET | Freq: Four times a day (QID) | ORAL | Status: DC | PRN

## 2021-10-08 MED ORDER — MIDAZOLAM HCL 5 MG/5ML IJ SOLN
INTRAMUSCULAR | Status: DC | PRN
  Administered 2021-10-08: 2 mg via INTRAVENOUS

## 2021-10-08 MED ORDER — CEFAZOLIN SODIUM-DEXTROSE 2-3 GM-%(50ML) IV SOLR
INTRAVENOUS | Status: DC | PRN
  Administered 2021-10-08: 2 g via INTRAVENOUS

## 2021-10-08 MED ORDER — OXYCODONE HCL 5 MG/5ML PO SOLN: 5.0000 mg | Freq: Once | ORAL | Status: DC | PRN

## 2021-10-08 MED ORDER — IOHEXOL 350 MG/ML SOLN
75.0000 mL | Freq: Once | INTRAVENOUS | Status: AC | PRN
  Administered 2021-10-08: 75 mL via INTRAVENOUS

## 2021-10-08 MED ORDER — CHLORHEXIDINE GLUCONATE 0.12 % MT SOLN: 15.0000 mL | Freq: Once | OROMUCOSAL | Status: AC

## 2021-10-08 MED ORDER — PROPOFOL 10 MG/ML IV BOLUS
INTRAVENOUS | Status: AC
  Filled 2021-10-08: qty 20

## 2021-10-08 MED ORDER — ENOXAPARIN SODIUM 40 MG/0.4ML IJ SOSY
40.0000 mg | PREFILLED_SYRINGE | INTRAMUSCULAR | Status: DC
  Administered 2021-10-08: 40 mg via SUBCUTANEOUS
  Filled 2021-10-08: qty 0.4

## 2021-10-08 MED ORDER — ROCURONIUM BROMIDE 10 MG/ML (PF) SYRINGE
PREFILLED_SYRINGE | INTRAVENOUS | Status: DC | PRN
  Administered 2021-10-08: 80 mg via INTRAVENOUS

## 2021-10-08 MED ORDER — BUPIVACAINE-EPINEPHRINE (PF) 0.25% -1:200000 IJ SOLN
INTRAMUSCULAR | Status: AC
  Filled 2021-10-08: qty 30

## 2021-10-08 MED ORDER — ACETAMINOPHEN 160 MG/5ML PO SOLN: 1000.0000 mg | Freq: Once | ORAL | Status: DC | PRN

## 2021-10-08 MED ORDER — SODIUM CHLORIDE 0.9 % IV SOLN
2.0000 g | Freq: Once | INTRAVENOUS | Status: AC
  Administered 2021-10-08: 2 g via INTRAVENOUS
  Filled 2021-10-08: qty 20

## 2021-10-08 MED ORDER — FENTANYL CITRATE (PF) 250 MCG/5ML IJ SOLN
INTRAMUSCULAR | Status: AC
  Filled 2021-10-08: qty 5

## 2021-10-08 MED ORDER — CHLORHEXIDINE GLUCONATE 0.12 % MT SOLN
OROMUCOSAL | Status: AC
  Administered 2021-10-08: 15 mL via OROMUCOSAL
  Filled 2021-10-08: qty 15

## 2021-10-08 MED ORDER — METRONIDAZOLE 500 MG/100ML IV SOLN: 500.0000 mg | Freq: Two times a day (BID) | INTRAVENOUS | Status: DC

## 2021-10-08 MED ORDER — METHOCARBAMOL 750 MG PO TABS: 750.0000 mg | ORAL_TABLET | Freq: Four times a day (QID) | ORAL | 1 refills | Status: AC

## 2021-10-08 SURGICAL SUPPLY — 53 items
ADH SKN CLS LQ APL DERMABOND (GAUZE/BANDAGES/DRESSINGS) ×1
APL PRP STRL LF DISP 70% ISPRP (MISCELLANEOUS) ×1
APPLIER CLIP ROT 10 11.4 M/L (STAPLE)
APR CLP MED LRG 11.4X10 (STAPLE)
BAG COUNTER SPONGE SURGICOUNT (BAG) ×2 IMPLANT
BAG SPEC RTRVL LRG 6X4 10 (ENDOMECHANICALS) ×1
BAG SPNG CNTER NS LX DISP (BAG) ×1
BLADE CLIPPER SURG (BLADE) ×2 IMPLANT
CANISTER SUCT 3000ML PPV (MISCELLANEOUS) ×2 IMPLANT
CHLORAPREP W/TINT 26 (MISCELLANEOUS) ×2 IMPLANT
CLIP APPLIE ROT 10 11.4 M/L (STAPLE) IMPLANT
COVER SURGICAL LIGHT HANDLE (MISCELLANEOUS) ×2 IMPLANT
CUTTER FLEX LINEAR 45M (STAPLE) ×2 IMPLANT
DERMABOND ADHESIVE PROPEN (GAUZE/BANDAGES/DRESSINGS) ×1
DERMABOND ADVANCED .7 DNX6 (GAUZE/BANDAGES/DRESSINGS) ×1 IMPLANT
ELECT CAUTERY BLADE 6.4 (BLADE) ×2 IMPLANT
ELECT REM PT RETURN 9FT ADLT (ELECTROSURGICAL) ×2
ELECTRODE REM PT RTRN 9FT ADLT (ELECTROSURGICAL) ×1 IMPLANT
GLOVE SURG ENC MOIS LTX SZ6.5 (GLOVE) ×2 IMPLANT
GLOVE SURG UNDER POLY LF SZ6 (GLOVE) ×2 IMPLANT
GOWN STRL REUS W/ TWL LRG LVL3 (GOWN DISPOSABLE) ×3 IMPLANT
GOWN STRL REUS W/TWL LRG LVL3 (GOWN DISPOSABLE) ×6
HEMOSTAT SURGICEL 2X14 (HEMOSTASIS) ×2 IMPLANT
IV NS 1000ML (IV SOLUTION) ×2
IV NS 1000ML BAXH (IV SOLUTION) ×1 IMPLANT
KIT BASIN OR (CUSTOM PROCEDURE TRAY) ×2 IMPLANT
KIT TURNOVER KIT B (KITS) ×2 IMPLANT
NEEDLE INSUFFLATION 14GA 120MM (NEEDLE) IMPLANT
NS IRRIG 1000ML POUR BTL (IV SOLUTION) ×2 IMPLANT
PAD ARMBOARD 7.5X6 YLW CONV (MISCELLANEOUS) ×2 IMPLANT
PENCIL BUTTON HOLSTER BLD 10FT (ELECTRODE) ×2 IMPLANT
POUCH SPECIMEN RETRIEVAL 10MM (ENDOMECHANICALS) ×2 IMPLANT
RELOAD STAPLE TA45 3.5 REG BLU (ENDOMECHANICALS) IMPLANT
RELOAD STAPLER BLUE 60MM (STAPLE) ×1 IMPLANT
RELOAD STAPLER WHITE 60MM (STAPLE) ×1 IMPLANT
SCISSORS LAP 5X35 DISP (ENDOMECHANICALS) IMPLANT
SET IRRIG TUBING LAPAROSCOPIC (IRRIGATION / IRRIGATOR) ×2 IMPLANT
SET TUBE SMOKE EVAC HIGH FLOW (TUBING) ×2 IMPLANT
SHEARS HARMONIC ACE PLUS 36CM (ENDOMECHANICALS) IMPLANT
SLEEVE ENDOPATH XCEL 5M (ENDOMECHANICALS) ×4 IMPLANT
SOL ANTI FOG 6CC (MISCELLANEOUS) ×1 IMPLANT
SOLUTION ANTI FOG 6CC (MISCELLANEOUS) ×1
SPECIMEN JAR SMALL (MISCELLANEOUS) ×2 IMPLANT
STAPLE ECHEON FLEX 60 POW ENDO (STAPLE) ×2 IMPLANT
STAPLER RELOAD BLUE 60MM (STAPLE) ×2
STAPLER RELOAD WHITE 60MM (STAPLE) ×2
SUT MNCRL AB 4-0 PS2 18 (SUTURE) ×2 IMPLANT
SUT VICRYL 0 UR6 27IN ABS (SUTURE) ×2 IMPLANT
TOWEL GREEN STERILE (TOWEL DISPOSABLE) ×2 IMPLANT
TOWEL GREEN STERILE FF (TOWEL DISPOSABLE) ×2 IMPLANT
TRAY LAPAROSCOPIC MC (CUSTOM PROCEDURE TRAY) ×2 IMPLANT
TROCAR XCEL BLUNT TIP 100MML (ENDOMECHANICALS) IMPLANT
TROCAR XCEL NON-BLD 5MMX100MML (ENDOMECHANICALS) ×2 IMPLANT

## 2021-10-08 NOTE — ED Triage Notes (Signed)
Pt had appendectomy and was discharged this afternoon. Rec'd call telling him he had a positive blood culture and should return to be seen.

## 2021-10-08 NOTE — Progress Notes (Addendum)
Patient seen and examined. CT reviewed. Plan for lap appy. Informed consent was obtained after detailed explanation of risks, including bleeding, infection, abscess, staple line leak, stump appendicitis, injury to surrounding structures, and need for conversion to open procedure. All questions answered to the patient's satisfaction. Consent obtained in Spanish using interpreter services. Family at bedside, all questions answered. Must void immediately pre-op. Anticipate d/c home post-op.   Diamantina Monks, MD General and Trauma Surgery [facility]

## 2021-10-08 NOTE — Anesthesia Procedure Notes (Signed)
Procedure Name: Intubation Date/Time: 10/08/2021 11:48 AM Performed by: Lynnell Chad, CRNA Pre-anesthesia Checklist: Patient identified, Emergency Drugs available, Suction available and Patient being monitored Patient Re-evaluated:Patient Re-evaluated prior to induction Oxygen Delivery Method: Circle System Utilized Preoxygenation: Pre-oxygenation with 100% oxygen Induction Type: IV induction Ventilation: Mask ventilation without difficulty Laryngoscope Size: Miller and 3 Grade View: Grade I Tube type: Oral Tube size: 7.5 mm Number of attempts: 1 Airway Equipment and Method: Stylet and Oral airway Placement Confirmation: ETT inserted through vocal cords under direct vision, positive ETCO2 and breath sounds checked- equal and bilateral Secured at: 22 cm Tube secured with: Tape Dental Injury: Teeth and Oropharynx as per pre-operative assessment

## 2021-10-08 NOTE — Discharge Instructions (Addendum)
Puede empezar a llover 13 de [place]. No despegue ni frote el pegamento para la piel. Puede permitir que el agua tibia con jabn corra sobre la incisin, luego enjuague y seque. No se sumerja en agua (baeras, jacuzzis, piscinas, lagos, ocanos) durante [employer].  No levantar ms de 5 libras durante seis semanas.  Rgimen para el dolor: tome tylenol (acetaminofn) de venta libre 1000 mg cada seis horas, ibuprofeno recetado (600 mg) cada seis horas y robaxin (metocarbamol) 750 mg cada seis horas. Con los tres, deberas tomar [employer]. Ejemplo: tylenol (acetaminofeno) a las 8 a. m., ibuprofeno a las 10 a. m., robaxin (metocarbamol) a las 12 p. m., tylenol (acetaminofeno) nuevamente a las 2 p. m., ibuprofeno nuevamente a las 4 p. Tambin tiene una receta para oxicodona, que debe tomar si el tylenol (acetaminofn), el ibuprofeno y la robaxina (metocarbamol) no son suficientes para Physicist, medical. Puede tomar la oxicodona con la frecuencia de cada cuatro horas segn sea necesario, pero si est tomando los otros medicamentos como se indic anteriormente, no debera necesitar la oxicodona con tanta frecuencia. Tambin le han recetado colace (docusato), que es un ablandador de heces. Tmelo segn lo recetado porque la oxicodona puede causar estreimiento y Financial trader (docusato) minimizar o Chief Strategy Officer.  Llame a la oficina al [phone] si la temperatura es superior a 101.5 F, empeora el dolor, el enrojecimiento o el calor en el sitio de la incisin.  Por favor, llame al [phone] para hacer una cita durante 1 semana despus de la ciruga para la evaluacin de la herida.     CIRUGIA LAPAROSCOPICA: INSTRUCCIONES DE POST OPERATORIO.  Revise siempre los documentos que le entreguen en el lugar donde se ha hecho la [place].  SI USTED NECESITA DOCUMENTOS DE INCAPACIDAD (DISABLE) O DE PERMISO FAMILAR (FAMILY LEAVE) NECESITA TRAERLOS A LA OFICINA PARA QUE SEAN PROCESADOS. NO  SE  LOS DE A SU DOCTOR. A su alta del hospital se le dara una receta para Human resources officer. Tomela como ha sido recetada, si la necesita. Si no la necesita puede tomar, Acetaminofen (Tylenol) o Ibuprofen (Advil) para aliviar dolor moderado. Continue tomando el resto de sus medicinas. Si necesita rellenar la receta, llame a la farmacia. ellos contactan a nuestra oficina pidiendo autorizacion. Este tipo de receta no pueden ser [employer] de las  5pm o [employer] fines de [place]. Con relacion a la dieta: debe ser [employer] primeros dias despues que llege a la casa. Ejemplo: sopas y galleticas. Tome bastante liquido esos dias. [employer] de los pacientes padecen de inflamacion y cambio de coloracion de la piel alrededor de las incisiones. esto toma dias en resolver.  pnerse una bolsa de hielo en el area affectada ayuda..  Es comun tambien tener un poco de estrenimiento si esta tomado medicinas para Chief Technology Officer. incremente la cantidad de liquidos a tomar y Engineer, production (Colace) esto previene el problema. Si ya tiene estrenimiento, es Designer, jewellery no ha defecado en 48 horas, puede tomar un laxativo (Milk of Magnesia or Miralax) uselo como el paquete le explica.  A menos que se le diga algo diferente. Remueva el bendaje a las 24-48 horas despues dela [place]. y puede banarse en la ducha sin ningun problema. usted puede tener steri-strips (pequenas curitas transparentes en la piel puesta encima de la incision)  Estas banditas strips should be left on the skin for 7-10 days.   Si su cirujano puso pegamento encima de la incision usted puede banarse bajo  la ducha en 24 horas. Este pegamento empezara a caerse en las proximas 2-3 semanas. Si le pusieron suturas o presillas (grapos) estos seran quitados en su proxima cita en la oficina. . ACTIVIDADES:  Puede hacer actividad ligera.  Como caminar , subir escaleras y poco a poco irlas incrementando tanto como las [place]. Puede tener relaciones sexuales cuando sea comfortable.  No carge objetos pesados o haga esfuerzos que no sean aprovados por su doctor. Puede manejar en cuanto no esta tomando medicamentos fuertes (narcoticos) para Chief Technology Officer, pueda abrochar confortablemente el cinturon de seguridad, y pueda Personnel officer y usar los pedales de su vehiculo con seguridad. PUEDE REGRESAR A TRABAJAR  Debe ver a su doctor para una cita de seguimiento en 2-3 semanas despues de la [place].   CUANDO LLAMAR A SU MEDICO: FIEBRE mayor de  101.0 No produccion de [place]. Sangramiento continue de la herida Incremento de Engineer, mining, enrojecimientio o drenaje de la herida (incision) Incremento de dolor abdominal.  The clinic staff is available to answer your questions during regular business hours.  Please don't hesitate to call and ask to speak to one of the nurses for clinical concerns.  If you have a medical emergency, go to the nearest emergency room or call 911.  A surgeon from [facility] is always on call at the hospital. [address], [place], [place]  [address] ? [address] [address], [place], [place]   [address] [phone] ? [phone] ? FAX [phone] Web site: [email or web address]

## 2021-10-08 NOTE — Anesthesia Postprocedure Evaluation (Signed)
Anesthesia Post Note  Patient: Angel Yates  Procedure(s) Performed: APPENDECTOMY LAPAROSCOPIC     Patient location during evaluation: PACU Anesthesia Type: General Level of consciousness: awake and alert Pain management: pain level controlled Vital Signs Assessment: post-procedure vital signs reviewed and stable Respiratory status: spontaneous breathing, nonlabored ventilation, respiratory function stable and patient connected to nasal cannula oxygen Cardiovascular status: blood pressure returned to baseline and stable Postop Assessment: no apparent nausea or vomiting Anesthetic complications: no   No notable events documented.  Last Vitals:  Vitals:   10/08/21 1405 10/08/21 1428  BP: 127/76 127/82  Pulse: (!) 105 99  Resp: 17 16  Temp:  (!) 38.4 C  SpO2: 95% 98%    Last Pain:  Vitals:   10/08/21 1428  TempSrc: Oral  PainSc:                  [name]

## 2021-10-08 NOTE — Op Note (Signed)
   Operative Note   Date: 10/08/2021  Procedure: laparoscopic appendectomy  Pre-op diagnosis: acute appendicitis Post-op diagnosis: Grade 2 appendicitis  Indication and clinical history: The patient is a 40 y.o. year old male with acute appendicitis     Surgeon: Diamantina Monks, MD  Anesthesiologist: Maple Hudson, MD Anesthesia: General  Findings:  Specimen: appendix EBL: <5cc Drains/Implants: none  Disposition: PACU - hemodynamically stable.  Description of procedure: The patient was positioned supine on the operating room table. Time-out was performed verifying correct patient, procedure, signature of informed consent, and administration of pre-operative antibiotics. General anesthetic induction and intubation were uneventful. Foley catheter insertion was not performed as patient voided immediately prior to the procedure . The abdomen was prepped and draped in the usual sterile fashion. An infra-umbilical incision was made using an open technique using zero vicryl stay sutures on either side of the fascia and a 63mm Hassan port inserted. After establishing pneumoperitoneum, which the patient tolerated well, the abdominal cavity was inspected and no injury of any intra-abdominal structures was identified. Two additional five millimeter ports were placed under direct visualization and using local anesthetic in the suprapubic and left lower quadrant regions. The patient was repositioned to Trendelenburg with the left side down. Further inspection of the right lower quadrant revealed  gangrenous, necrotic appendix with associate feculent peritonitis . Adhesiolysis was performed for approximately 15 minutes during dissection, mobilization, and identification of the appendix. The appendix was dissected away from its mesoappendix and an endoscopic stapler used to divide the mesoappendix using a vascular load. A bowel load of the endoscopic stapler was used to staple across the appendix at its base.  Both staple lines were inspected and found to be intact and without bleeding. The appendix was placed in an endoscopic specimen retrieval bag, removed via the umbilical port site, and sent to pathology as a permanent specimen. The right lower quadrant was again inspected and hemostasis confirmed. Any remaining fluid identified was suctioned. The suprapubic and left lower quadrant ports were removed under direct visualization and hemostasis confirmed. The umbilical port was removed last after desufflating the abdomen and the fascia re-approximated using the stay sutures. Additional local anesthetic was administered at the umbilical incision site. The skin of all port sites was closed with 4-0 monocryl. Sterile dressings were applied. All sponge and instrument counts were correct at the conclusion of the procedure. The patient was awakened from anesthesia, extubated uneventfully, and transported to the PACU in good condition. There were no complications.   Upon entering the abdomen (organ space), I encountered feculent peritonitis.  CASE DATA:  Type of patient?: DOW CASE (Surgical Hospitalist [facility])  Status of Case? URGENT Add On  Infection Present At Time Of Surgery (PATOS)?  FECULENT PERITONITIS   Diamantina Monks, MD General and Trauma Surgery [facility]

## 2021-10-08 NOTE — ED Provider Notes (Signed)
[facility] EMERGENCY DEPARTMENT Provider Note   CSN: [id number] Arrival date & time: 10/07/21  1521     History Chief Complaint  Patient presents with   Abdominal Pain    Angel Yates is a 40 y.o. male.  Patient presents to the emergency department with a chief complaint of right-sided lower abdominal pain.  He reports associated fever.  He states that symptoms started yesterday.  Denies nausea, vomiting, or diarrhea.  Denies any treatment prior to arrival.  Denies any other associated symptoms.  Pain is worsened with palpation and movement.  The history is provided by the patient. No language interpreter was used.      No past medical history on file.  Patient Active Problem List   Diagnosis Date Noted   Closed fracture of right distal radius 07/14/2019    Past Surgical History:  Procedure Laterality Date   ORIF RADIAL FRACTURE Right 07/14/2019   ORIF WRIST FRACTURE Right 07/14/2019   Procedure: Right distal radius open reduction, internal fixation and surgery as indicated;  Surgeon: Ernest Mallick, MD;  Location: [facility];  Service: Orthopedics;  Laterality: Right;        No family history on file.  Social History   Tobacco Use   Smoking status: Never   Smokeless tobacco: Never  Vaping Use   Vaping Use: Never used  Substance Use Topics   Alcohol use: Never   Drug use: Never    Home Medications Prior to Admission medications   Medication Sig Start Date End Date Taking? Authorizing Provider  HYDROcodone-acetaminophen (NORCO/VICODIN) 5-325 MG tablet Take 1-2 tablets by mouth every 6 (six) hours as needed. 07/14/19   Cain Saupe III, MD  tamsulosin (FLOMAX) 0.4 MG CAPS capsule Take 1 capsule (0.4 mg total) by mouth daily after supper. 10/07/21   Wallis Bamberg, PA-C    Allergies    Patient has no known allergies.  Review of Systems   Review of Systems  All other systems reviewed and are negative.  Physical  Exam Updated Vital Signs BP 121/86 (BP Location: Left Arm)   Pulse (!) 116   Temp 99.1 F (37.3 C) (Oral)   Resp 20   Ht 6' (1.829 m)   Wt 90.7 kg   SpO2 99%   BMI 27.12 kg/m   Physical Exam Vitals and nursing note reviewed.  Constitutional:      Appearance: He is well-developed.  HENT:     Head: Normocephalic and atraumatic.  Eyes:     Conjunctiva/sclera: Conjunctivae normal.  Cardiovascular:     Rate and Rhythm: Normal rate and regular rhythm.     Heart sounds: No murmur heard. Pulmonary:     Effort: Pulmonary effort is normal. No respiratory distress.     Breath sounds: Normal breath sounds.  Abdominal:     Palpations: Abdomen is soft.     Tenderness: There is abdominal tenderness.  Musculoskeletal:        General: Normal range of motion.     Cervical back: Neck supple.  Skin:    General: Skin is warm and dry.  Neurological:     Mental Status: He is alert and oriented to person, place, and time.  Psychiatric:        Mood and Affect: Mood normal.        Behavior: Behavior normal.    ED Results / Procedures / Treatments   Labs (all labs ordered are listed, but only abnormal results are displayed) Labs  Reviewed  RESP PANEL BY RT-PCR (FLU A&B, COVID) ARPGX2  CULTURE, BLOOD (ROUTINE X 2)  CULTURE, BLOOD (ROUTINE X 2)  CBC WITH DIFFERENTIAL/PLATELET  BASIC METABOLIC PANEL    EKG None  Radiology CT Abdomen Pelvis W Contrast  Result Date: 10/08/2021 CLINICAL DATA:  Abdominal pain, fever EXAM: CT ABDOMEN AND PELVIS WITH CONTRAST TECHNIQUE: Multidetector CT imaging of the abdomen and pelvis was performed using the standard protocol following bolus administration of intravenous contrast. CONTRAST:  67mL OMNIPAQUE IOHEXOL 350 MG/ML SOLN COMPARISON:  None. FINDINGS: Lower chest: Lung bases are clear. Hepatobiliary: Liver is within normal limits. Gallbladder is unremarkable. No intrahepatic or extrahepatic ductal dilatation. Pancreas: Within normal limits. Spleen:  Within normal limits. Adrenals/Urinary Tract: Adrenal glands are within normal limits. Kidneys are within normal limits.  No hydronephrosis. Bladder is within normal limits. Stomach/Bowel: Stomach is within normal limits. No evidence of bowel obstruction. Dilated appendix with periappendiceal inflammatory changes (series 3/image 81), reflecting acute appendicitis. No appendicolith. No drainable fluid collection/abscess. No free air to suggest macroscopic perforation. Vascular/Lymphatic: No evidence of abdominal aortic aneurysm. No suspicious abdominopelvic lymphadenopathy. Reproductive: Prostate is unremarkable. Other: Tiny fat containing bilateral inguinal hernias. No abdominopelvic ascites. Musculoskeletal: Visualized osseous structures are within normal limits. IMPRESSION: Uncomplicated acute appendicitis. No drainable fluid collection/abscess. No free air to suggest macroscopic perforation. Electronically Signed   By: Charline Bills M.D.   On: 10/08/2021 00:16    Procedures Procedures   Medications Ordered in ED Medications  acetaminophen (TYLENOL) tablet 650 mg (has no administration in time range)  cefTRIAXone (ROCEPHIN) 2 g in sodium chloride 0.9 % 100 mL IVPB (has no administration in time range)    And  metroNIDAZOLE (FLAGYL) IVPB 500 mg (has no administration in time range)  morphine 4 MG/ML injection 4 mg (has no administration in time range)  ondansetron (ZOFRAN) injection 4 mg (has no administration in time range)  sodium chloride 0.9 % bolus 1,000 mL (has no administration in time range)  iohexol (OMNIPAQUE) 350 MG/ML injection 75 mL (75 mLs Intravenous Contrast Given 10/08/21 0007)    ED Course  I have reviewed the triage vital signs and the nursing notes.  Pertinent labs & imaging results that were available during my care of the patient were reviewed by me and considered in my medical decision making (see chart for details).    MDM Rules/Calculators/A&P                            Patient here with right lower quadrant pain.  Symptoms began yesterday.  CT scan ordered in triage shows acute appendicitis.  Labs were not ordered in triage, so I added these on.  Patient does have a leukocytosis 27.  He has been intermittently febrile and tachycardic.  He is getting fluids and antibiotics.  Case discussed with Dr. Freida Busman, who will come to admit the patient. Final Clinical Impression(s) / ED Diagnoses Final diagnoses:  Acute appendicitis with localized peritonitis, without perforation, abscess, or gangrene    Rx / DC Orders ED Discharge Orders     None        Roxy Horseman, PA-C 10/08/21 6606    Geoffery Lyons, MD 10/08/21 [phone]

## 2021-10-08 NOTE — Anesthesia Preprocedure Evaluation (Signed)
Anesthesia Evaluation  Patient identified by MRN, date of birth, ID band Patient awake    Reviewed: Allergy & Precautions, NPO status , Patient's Chart, lab work & pertinent test results  History of Anesthesia Complications Negative for: history of anesthetic complications  Airway Mallampati: I  TM Distance: >3 FB Neck ROM: Full    Dental  (+) Dental Advisory Given, Teeth Intact   Pulmonary neg pulmonary ROS,    breath sounds clear to auscultation       Cardiovascular negative cardio ROS   Rhythm:Regular     Neuro/Psych negative neurological ROS  negative psych ROS   GI/Hepatic Neg liver ROS, Appendicitis   Endo/Other  negative endocrine ROS  Renal/GU negative Renal ROS     Musculoskeletal negative musculoskeletal ROS (+)   Abdominal   Peds  Hematology negative hematology ROS (+)   Anesthesia Other Findings   Reproductive/Obstetrics                             Anesthesia Physical Anesthesia Plan  ASA: 1  Anesthesia Plan: General   Post-op Pain Management:    Induction: Intravenous and Rapid sequence  PONV Risk Score and Plan: 2 and Ondansetron and Dexamethasone  Airway Management Planned: Oral ETT  Additional Equipment: None  Intra-op Plan:   Post-operative Plan: Extubation in OR  Informed Consent: I have reviewed the patients History and Physical, chart, labs and discussed the procedure including the risks, benefits and alternatives for the proposed anesthesia with the patient or authorized representative who has indicated his/her understanding and acceptance.     Dental advisory given and Interpreter used for interveiw  Plan Discussed with: CRNA and Anesthesiologist  Anesthesia Plan Comments:         Anesthesia Quick Evaluation

## 2021-10-08 NOTE — Plan of Care (Signed)
Discharged home.

## 2021-10-08 NOTE — Plan of Care (Signed)
  Problem: Education: Goal: Knowledge of General Education information will improve Description Including pain rating scale, medication(s)/side effects and non-pharmacologic comfort measures Outcome: Progressing   Problem: Health Behavior/Discharge Planning: Goal: Ability to manage health-related needs will improve Outcome: Progressing   

## 2021-10-08 NOTE — H&P (Signed)
Angel Yates 30-Jan-1981  [id number].    Requesting MD: Roxy Horseman, PA-C Chief Complaint/Reason for Consult: Acute appendicitis  HPI:  Mr. Angel Yates is a 40 yo male who presented to the ED with abdominal pain.  The pain began 2 days ago and is located in the right lower quadrant and right flank.  He initially presented to urgent care was noted to have a leukocytosis of 27, and was sent to the ED.  Creatinine was mildly elevated at 1.5.  Since arrival to the ED he has been febrile to 38.9 with associated tachycardia.  A CT scan showed stranding around the appendix consistent with nonperforated appendicitis.  He has been given a dose of ceftriaxone and Flagyl in the ED.  The patient is otherwise in good health and has not had any prior abdominal surgeries.  ROS: ROS  No family history on file.  History reviewed. No pertinent past medical history.  Past Surgical History:  Procedure Laterality Date   ORIF RADIAL FRACTURE Right 07/14/2019   ORIF WRIST FRACTURE Right 07/14/2019   Procedure: Right distal radius open reduction, internal fixation and surgery as indicated;  Surgeon: Ernest Mallick, MD;  Location: [facility] OR;  Service: Orthopedics;  Laterality: Right;     Social History:  reports that he has never smoked. He has never used smokeless tobacco. He reports that he does not drink alcohol and does not use drugs.  Allergies: No Known Allergies  (Not in a hospital admission)    Physical Exam: Blood pressure 110/61, pulse (!) 104, temperature 100 F (37.8 C), resp. rate 17, height 6' (1.829 m), weight 90.7 kg, SpO2 98 %. General: resting comfortably, appears stated age, no apparent distress Neurological: alert and oriented, no focal deficits, cranial nerves grossly in tact HEENT: normocephalic, atraumatic, oropharynx clear, no scleral icterus CV: regular rate and rhythm, no murmurs, extremities warm and well-perfused Respiratory: normal work of  breathing on room air, symmetric chest wall expansion Abdomen: soft, nondistended, focally tender to palpation in the right lower quadrant.  No surgical scars. Extremities: warm and well-perfused, no deformities, moving all extremities spontaneously Psychiatric: normal mood and affect Skin: warm and dry, no jaundice, no rashes or lesions   Results for orders placed or performed during the hospital encounter of 10/07/21 (from the past 48 hour(s))  CBC with Differential/Platelet     Status: Abnormal   Collection Time: 10/08/21  1:42 AM  Result Value Ref Range   WBC 24.9 (H) 4.0 - 10.5 K/uL   RBC 5.05 4.22 - 5.81 MIL/uL   Hemoglobin 15.4 13.0 - 17.0 g/dL   HCT 64.4 03.4 - 74.2 %   MCV 87.9 80.0 - 100.0 fL   MCH 30.5 26.0 - 34.0 pg   MCHC 34.7 30.0 - 36.0 g/dL   RDW 59.5 63.8 - 75.6 %   Platelets 260 150 - 400 K/uL   nRBC 0.0 0.0 - 0.2 %   Neutrophils Relative % 91 %   Neutro Abs 22.6 (H) 1.7 - 7.7 K/uL   Lymphocytes Relative 3 %   Lymphs Abs 0.8 0.7 - 4.0 K/uL   Monocytes Relative 5 %   Monocytes Absolute 1.2 (H) 0.1 - 1.0 K/uL   Eosinophils Relative 0 %   Eosinophils Absolute 0.0 0.0 - 0.5 K/uL   Basophils Relative 0 %   Basophils Absolute 0.1 0.0 - 0.1 K/uL   Immature Granulocytes 1 %   Abs Immature Granulocytes 0.15 (H) 0.00 - 0.07 K/uL  Comment: Performed at [facility], [address] [place], [place] [address]  Basic metabolic panel     Status: Abnormal   Collection Time: 10/08/21  1:42 AM  Result Value Ref Range   Sodium 133 (L) 135 - 145 mmol/L   Potassium 3.6 3.5 - 5.1 mmol/L   Chloride 100 98 - 111 mmol/L   CO2 22 22 - 32 mmol/L   Glucose, Bld 118 (H) 70 - 99 mg/dL    Comment: Glucose reference range applies only to samples taken after fasting for at least 8 hours.   BUN 19 6 - 20 mg/dL   Creatinine, Ser 6.04 (H) 0.61 - 1.24 mg/dL   Calcium 9.1 8.9 - 54.0 mg/dL   GFR, Estimated 60 (L) >60 mL/min    Comment: (NOTE) Calculated using the CKD-EPI  Creatinine Equation (2021)    Anion gap 11 5 - 15    Comment: Performed at [facility], [address], [place], [place] [address]  Resp Panel by RT-PCR (Flu A&B, Covid) Nasopharyngeal Swab     Status: None   Collection Time: 10/08/21  2:15 AM   Specimen: Nasopharyngeal Swab; Nasopharyngeal(NP) swabs in vial transport medium  Result Value Ref Range   SARS Coronavirus 2 by RT PCR NEGATIVE NEGATIVE    Comment: (NOTE) SARS-CoV-2 target nucleic acids are NOT DETECTED.  The SARS-CoV-2 RNA is generally detectable in upper respiratory specimens during the acute phase of infection. The lowest concentration of SARS-CoV-2 viral copies this assay can detect is 138 copies/mL. A negative result does not preclude SARS-Cov-2 infection and should not be used as the sole basis for treatment or other patient management decisions. A negative result may occur with  improper specimen collection/handling, submission of specimen other than nasopharyngeal swab, presence of viral mutation(s) within the areas targeted by this assay, and inadequate number of viral copies(<138 copies/mL). A negative result must be combined with clinical observations, patient history, and epidemiological information. The expected result is Negative.  Fact Sheet for Patients:  BloggerCourse.com  Fact Sheet for Healthcare Providers:  SeriousBroker.it  This test is no t yet approved or cleared by the [place] FDA and  has been authorized for detection and/or diagnosis of SARS-CoV-2 by FDA under an Emergency Use Authorization (EUA). This EUA will remain  in effect (meaning this test can be used) for the duration of the COVID-19 declaration under Section 564(b)(1) of the Act, 21 U.S.C.section 360bbb-3(b)(1), unless the authorization is terminated  or revoked sooner.       Influenza A by PCR NEGATIVE NEGATIVE   Influenza B by PCR NEGATIVE NEGATIVE    Comment:  (NOTE) The Xpert Xpress SARS-CoV-2/FLU/RSV plus assay is intended as an aid in the diagnosis of influenza from Nasopharyngeal swab specimens and should not be used as a sole basis for treatment. Nasal washings and aspirates are unacceptable for Xpert Xpress SARS-CoV-2/FLU/RSV testing.  Fact Sheet for Patients: BloggerCourse.com  Fact Sheet for Healthcare Providers: SeriousBroker.it  This test is not yet approved or cleared by the [place] FDA and has been authorized for detection and/or diagnosis of SARS-CoV-2 by FDA under an Emergency Use Authorization (EUA). This EUA will remain in effect (meaning this test can be used) for the duration of the COVID-19 declaration under Section 564(b)(1) of the Act, 21 U.S.C. section 360bbb-3(b)(1), unless the authorization is terminated or revoked.  Performed at [facility], [address], [place], [place] [address]    CT Abdomen Pelvis W  Contrast  Result Date: 10/08/2021 CLINICAL DATA:  Abdominal pain, fever EXAM: CT ABDOMEN AND PELVIS WITH CONTRAST TECHNIQUE: Multidetector CT imaging of the abdomen and pelvis was performed using the standard protocol following bolus administration of intravenous contrast. CONTRAST:  61mL OMNIPAQUE IOHEXOL 350 MG/ML SOLN COMPARISON:  None. FINDINGS: Lower chest: Lung bases are clear. Hepatobiliary: Liver is within normal limits. Gallbladder is unremarkable. No intrahepatic or extrahepatic ductal dilatation. Pancreas: Within normal limits. Spleen: Within normal limits. Adrenals/Urinary Tract: Adrenal glands are within normal limits. Kidneys are within normal limits.  No hydronephrosis. Bladder is within normal limits. Stomach/Bowel: Stomach is within normal limits. No evidence of bowel obstruction. Dilated appendix with periappendiceal inflammatory changes (series 3/image 81), reflecting acute appendicitis. No appendicolith. No drainable fluid  collection/abscess. No free air to suggest macroscopic perforation. Vascular/Lymphatic: No evidence of abdominal aortic aneurysm. No suspicious abdominopelvic lymphadenopathy. Reproductive: Prostate is unremarkable. Other: Tiny fat containing bilateral inguinal hernias. No abdominopelvic ascites. Musculoskeletal: Visualized osseous structures are within normal limits. IMPRESSION: Uncomplicated acute appendicitis. No drainable fluid collection/abscess. No free air to suggest macroscopic perforation. Electronically Signed   By: Charline Bills M.D.   On: 10/08/2021 00:16      Assessment/Plan 40 yo male with acute non-perforated appendicitis. I reviewed his imaging, which shows gas within the lumen of the appendix but there is periappendiceal stranding.  There is no abscess or free intraperitoneal air to suggest perforation. Appendix appears to be retrocecal. - NPO, IV fluid hydration - IV abx: Rocephin and Flagyl - Pain and nausea control - Plan for OR in am for laparoscopic appendectomy, will be done by Dr. Bedelia Person.  I discussed the procedure details with the patient at the bedside with the assistance of a Spanish interpreter. - VTE: lovenox, SCDs - Dispo: admit for observation   Sophronia Simas, MD [facility] Surgery General, Hepatobiliary and Pancreatic Surgery 10/08/21 6:35 AM

## 2021-10-08 NOTE — ED Provider Notes (Signed)
Emergency Medicine Provider Triage Evaluation Note  Angel Yates , a 40 y.o. male  was evaluated in triage.  Pt complains of abnormal labs.  Patient had appendectomy done earlier today.  He was discharged home however called back as he had positive blood cultures.  Per chart review patient grew Antero back paralysis and E. Coli.  He states that he has been running grade fevers since being home today.  His temp is 99.9 in the ED.  He complains of mild abdominal pain.  No vomiting.   Review of Systems  Positive: + positive blood cultures, abd pain, fever Negative: - vomiting  Physical Exam  BP 125/86   Pulse (!) 122   Temp 99.9 F (37.7 C) (Oral)   Resp 16   SpO2 97%  Gen:   Awake, no distress   Resp:  Normal effort  MSK:   Moves extremities without difficulty  Other:  Tachy. Mild abd TTP.   Medical Decision Making  Medically screening exam initiated at 9:12 PM.  Appropriate orders placed.  Monty Spicher was informed that the remainder of the evaluation will be completed by another provider, this initial triage assessment does not replace that evaluation, and the importance of remaining in the ED until their evaluation is complete.     Tanda Rockers, PA-C 10/08/21 2115    Gloris Manchester, MD 10/09/21 0157

## 2021-10-08 NOTE — Transfer of Care (Signed)
Immediate Anesthesia Transfer of Care Note  Patient: Angel Yates  Procedure(s) Performed: APPENDECTOMY LAPAROSCOPIC  Patient Location: PACU  Anesthesia Type:General  Level of Consciousness: awake and patient cooperative  Airway & Oxygen Therapy: Patient Spontanous Breathing and Patient connected to face mask oxygen  Post-op Assessment: Report given to RN and Post -op Vital signs reviewed and stable  Post vital signs: Reviewed and stable  Last Vitals:  Vitals Value Taken Time  BP 132/77 10/08/21 1306  Temp 37.1 C 10/08/21 1305  Pulse 103 10/08/21 1321  Resp 22 10/08/21 1321  SpO2 98 % 10/08/21 1321  Vitals shown include unvalidated device data.  Last Pain:  Vitals:   10/08/21 1305  TempSrc:   PainSc: Asleep         Complications: No notable events documented.

## 2021-10-08 NOTE — ED Notes (Signed)
Report given to [facility]. Pt is going to preop bay 37.

## 2021-10-09 ENCOUNTER — Encounter (HOSPITAL_COMMUNITY): Payer: Self-pay

## 2021-10-09 DIAGNOSIS — R7881 Bacteremia: Secondary | ICD-10-CM | POA: Diagnosis present

## 2021-10-09 LAB — RESP PANEL BY RT-PCR (FLU A&B, COVID) ARPGX2
Influenza A by PCR: NEGATIVE
Influenza B by PCR: NEGATIVE
SARS Coronavirus 2 by RT PCR: NEGATIVE

## 2021-10-09 LAB — CBC
HCT: 41.3 % (ref 39.0–52.0)
Hemoglobin: 14.1 g/dL (ref 13.0–17.0)
MCH: 30.5 pg (ref 26.0–34.0)
MCHC: 34.1 g/dL (ref 30.0–36.0)
MCV: 89.4 fL (ref 80.0–100.0)
Platelets: 221 10*3/uL (ref 150–400)
RBC: 4.62 MIL/uL (ref 4.22–5.81)
RDW: 12.8 % (ref 11.5–15.5)
WBC: 17.2 10*3/uL — ABNORMAL HIGH (ref 4.0–10.5)
nRBC: 0 % (ref 0.0–0.2)

## 2021-10-09 LAB — LACTIC ACID, PLASMA: Lactic Acid, Venous: 1.8 mmol/L (ref 0.5–1.9)

## 2021-10-09 MED ORDER — IBUPROFEN 400 MG PO TABS: 400.0000 mg | ORAL_TABLET | Freq: Four times a day (QID) | ORAL | Status: DC | PRN

## 2021-10-09 MED ORDER — ACETAMINOPHEN 500 MG PO TABS
1000.0000 mg | ORAL_TABLET | Freq: Three times a day (TID) | ORAL | Status: DC
  Administered 2021-10-09 – 2021-10-10 (×4): 1000 mg via ORAL
  Filled 2021-10-09 (×4): qty 2

## 2021-10-09 MED ORDER — METHOCARBAMOL 500 MG PO TABS
500.0000 mg | ORAL_TABLET | Freq: Four times a day (QID) | ORAL | Status: DC
  Administered 2021-10-09 – 2021-10-10 (×3): 500 mg via ORAL
  Filled 2021-10-09 (×3): qty 1

## 2021-10-09 MED ORDER — DOCUSATE SODIUM 100 MG PO CAPS
100.0000 mg | ORAL_CAPSULE | Freq: Two times a day (BID) | ORAL | Status: DC
  Administered 2021-10-09 – 2021-10-10 (×3): 100 mg via ORAL
  Filled 2021-10-09 (×3): qty 1

## 2021-10-09 MED ORDER — PIPERACILLIN-TAZOBACTAM 3.375 G IVPB
3.3750 g | Freq: Three times a day (TID) | INTRAVENOUS | Status: DC
  Administered 2021-10-09 – 2021-10-10 (×4): 3.375 g via INTRAVENOUS
  Filled 2021-10-09 (×6): qty 50

## 2021-10-09 MED ORDER — ONDANSETRON HCL 4 MG/2ML IJ SOLN: 4.0000 mg | Freq: Four times a day (QID) | INTRAMUSCULAR | Status: DC | PRN

## 2021-10-09 MED ORDER — PIPERACILLIN-TAZOBACTAM 3.375 G IVPB 30 MIN
3.3750 g | Freq: Once | INTRAVENOUS | Status: AC
  Administered 2021-10-09: 3.375 g via INTRAVENOUS
  Filled 2021-10-09: qty 50

## 2021-10-09 MED ORDER — ONDANSETRON 4 MG PO TBDP: 4.0000 mg | ORAL_TABLET | Freq: Four times a day (QID) | ORAL | Status: DC | PRN

## 2021-10-09 MED ORDER — TRAMADOL HCL 50 MG PO TABS: 50.0000 mg | ORAL_TABLET | Freq: Four times a day (QID) | ORAL | Status: DC | PRN

## 2021-10-09 MED ORDER — MORPHINE SULFATE (PF) 4 MG/ML IV SOLN
4.0000 mg | Freq: Once | INTRAVENOUS | Status: AC
  Administered 2021-10-09: 4 mg via INTRAVENOUS
  Filled 2021-10-09: qty 1

## 2021-10-09 MED ORDER — ENOXAPARIN SODIUM 40 MG/0.4ML IJ SOSY
40.0000 mg | PREFILLED_SYRINGE | INTRAMUSCULAR | Status: DC
  Administered 2021-10-10: 40 mg via SUBCUTANEOUS
  Filled 2021-10-09: qty 0.4

## 2021-10-09 NOTE — ED Provider Notes (Signed)
[facility] EMERGENCY DEPARTMENT Provider Note   CSN: [id number] Arrival date & time: 10/08/21  2044     History Chief Complaint  Patient presents with   Abnormal labs    Angel Yates is a 40 y.o. male.  The history is provided by the patient. A language interpreter was used.  He was admitted yesterday for for appendicitis, had an appendectomy and was discharged.  He was called at home to come back because blood culture was positive.  He denies fever, chills, sweats.  Abdominal pain has been tolerable.  He denies nausea or vomiting.   History reviewed. No pertinent past medical history.  Patient Active Problem List   Diagnosis Date Noted   Gram-negative bacteremia 10/09/2021   Appendicitis, acute 10/08/2021   Closed fracture of right distal radius 07/14/2019    Past Surgical History:  Procedure Laterality Date   APPENDECTOMY     LAPAROSCOPIC APPENDECTOMY N/A 10/08/2021   Procedure: APPENDECTOMY LAPAROSCOPIC;  Surgeon: Diamantina Monks, MD;  Location: [facility] OR;  Service: General;  Laterality: N/A;   ORIF RADIAL FRACTURE Right 07/14/2019   ORIF WRIST FRACTURE Right 07/14/2019   Procedure: Right distal radius open reduction, internal fixation and surgery as indicated;  Surgeon: Ernest Mallick, MD;  Location: [facility] OR;  Service: Orthopedics;  Laterality: Right;        No family history on file.  Social History   Tobacco Use   Smoking status: Never   Smokeless tobacco: Never  Vaping Use   Vaping Use: Never used  Substance Use Topics   Alcohol use: Never   Drug use: Never    Home Medications Prior to Admission medications   Medication Sig Start Date End Date Taking? Authorizing Provider  ibuprofen (ADVIL) 600 MG tablet Take 1 tablet (600 mg total) by mouth 4 (four) times daily. 10/08/21  Yes Diamantina Monks, MD  methocarbamol (ROBAXIN-750) 750 MG tablet Take 1 tablet (750 mg total) by mouth 4 (four) times daily. 10/08/21  Yes  Diamantina Monks, MD  docusate sodium (COLACE) 100 MG capsule Take 1 capsule (100 mg total) by mouth 2 (two) times daily. 10/08/21 01/06/22  Diamantina Monks, MD  oxyCODONE (ROXICODONE) 5 MG immediate release tablet Take 1 tablet (5 mg total) by mouth every 4 (four) hours as needed for severe pain. 10/08/21 10/08/22  Diamantina Monks, MD    Allergies    Patient has no known allergies.  Review of Systems   Review of Systems  All other systems reviewed and are negative.  Physical Exam Updated Vital Signs BP 113/82   Pulse 88   Temp 99.9 F (37.7 C) (Oral)   Resp (!) 30   SpO2 97%   Physical Exam Vitals and nursing note reviewed.  40 year old male, resting comfortably and in no acute distress. Vital signs are significant for elevated respiratory rate. Oxygen saturation is 97%, which is normal. Head is normocephalic and atraumatic. PERRLA, EOMI. Oropharynx is clear. Neck is nontender and supple without adenopathy or JVD. Back is nontender and there is no CVA tenderness. Lungs are clear without rales, wheezes, or rhonchi. Chest is nontender. Heart has regular rate and rhythm without murmur. Abdomen is soft, slightly distended but moderately tender diffusely. Extremities have no cyanosis or edema, full range of motion is present. Skin is warm and dry without rash. Neurologic: Mental status is normal, cranial nerves are intact, moves all extremities equally.  ED Results / Procedures / Treatments  Labs (all labs ordered are listed, but only abnormal results are displayed) Labs Reviewed  CBC WITH DIFFERENTIAL/PLATELET - Abnormal; Notable for the following components:      Result Value   WBC 19.6 (*)    Neutro Abs 17.4 (*)    Abs Immature Granulocytes 0.09 (*)    All other components within normal limits  COMPREHENSIVE METABOLIC PANEL - Abnormal; Notable for the following components:   Glucose, Bld 140 (*)    Creatinine, Ser 1.42 (*)    Calcium 8.6 (*)    ALT 65 (*)    All  other components within normal limits  URINALYSIS, ROUTINE W REFLEX MICROSCOPIC - Abnormal; Notable for the following components:   Hgb urine dipstick MODERATE (*)    Ketones, ur 5 (*)    All other components within normal limits  RESP PANEL BY RT-PCR (FLU A&B, COVID) ARPGX2  CULTURE, BLOOD (SINGLE)  LACTIC ACID, PLASMA  LACTIC ACID, PLASMA  CBC   Radiology CT Abdomen Pelvis W Contrast  Result Date: 10/08/2021 CLINICAL DATA:  Abdominal pain, fever EXAM: CT ABDOMEN AND PELVIS WITH CONTRAST TECHNIQUE: Multidetector CT imaging of the abdomen and pelvis was performed using the standard protocol following bolus administration of intravenous contrast. CONTRAST:  16mL OMNIPAQUE IOHEXOL 350 MG/ML SOLN COMPARISON:  None. FINDINGS: Lower chest: Lung bases are clear. Hepatobiliary: Liver is within normal limits. Gallbladder is unremarkable. No intrahepatic or extrahepatic ductal dilatation. Pancreas: Within normal limits. Spleen: Within normal limits. Adrenals/Urinary Tract: Adrenal glands are within normal limits. Kidneys are within normal limits.  No hydronephrosis. Bladder is within normal limits. Stomach/Bowel: Stomach is within normal limits. No evidence of bowel obstruction. Dilated appendix with periappendiceal inflammatory changes (series 3/image 81), reflecting acute appendicitis. No appendicolith. No drainable fluid collection/abscess. No free air to suggest macroscopic perforation. Vascular/Lymphatic: No evidence of abdominal aortic aneurysm. No suspicious abdominopelvic lymphadenopathy. Reproductive: Prostate is unremarkable. Other: Tiny fat containing bilateral inguinal hernias. No abdominopelvic ascites. Musculoskeletal: Visualized osseous structures are within normal limits. IMPRESSION: Uncomplicated acute appendicitis. No drainable fluid collection/abscess. No free air to suggest macroscopic perforation. Electronically Signed   By: Charline Bills M.D.   On: 10/08/2021 00:16     Procedures Procedures   Medications Ordered in ED Medications  piperacillin-tazobactam (ZOSYN) IVPB 3.375 g (3.375 g Intravenous New Bag/Given 10/09/21 0331)  morphine 4 MG/ML injection 4 mg (has no administration in time range)  enoxaparin (LOVENOX) injection 40 mg (has no administration in time range)  acetaminophen (TYLENOL) tablet 1,000 mg (has no administration in time range)  methocarbamol (ROBAXIN) tablet 500 mg (has no administration in time range)  ibuprofen (ADVIL) tablet 400 mg (has no administration in time range)  docusate sodium (COLACE) capsule 100 mg (has no administration in time range)  ondansetron (ZOFRAN-ODT) disintegrating tablet 4 mg (has no administration in time range)    Or  ondansetron (ZOFRAN) injection 4 mg (has no administration in time range)  traMADol (ULTRAM) tablet 50 mg (has no administration in time range)  piperacillin-tazobactam (ZOSYN) IVPB 3.375 g (has no administration in time range)  acetaminophen (TYLENOL) tablet 1,000 mg (1,000 mg Oral Given 10/08/21 2132)    ED Course  I have reviewed the triage vital signs and the nursing notes.  Pertinent lab results that were available during my care of the patient were reviewed by me and considered in my medical decision making (see chart for details).    MDM Rules/Calculators/A&P  Gram-negative bacteremia following appendectomy.  Patient is nontoxic in appearance.  WBC is elevated, but decreased compared with prior to being admitted yesterday.  Renal insufficiency is essentially unchanged from prior.  Mild elevation of ALT is not felt to be clinically significant.  Lactic acid level is normal.  Case is discussed with Dr. Freida Busman of general surgery service who agrees to admit the patient.  He is given an initial dose of Zosyn.  Positive blood culture was only in the aerobic bottle, so I do not feel he needs metronidazole to be added to his regimen.  Final Clinical Impression(s) /  ED Diagnoses Final diagnoses:  Gram-negative bacteremia  Renal insufficiency  Elevated AST (SGOT)    Rx / DC Orders ED Discharge Orders     None        Dione Booze, MD 10/09/21 [phone]

## 2021-10-09 NOTE — H&P (Signed)
Angel Yates 07-23-1981  [id number].    Requesting MD: Dr. Dione Booze Chief Complaint/Reason for Consult: bacteremia secondary to appendicitis  HPI:  Angel Yates who was admitted early yesterday morning with fevers and leukocytosis secondary to acute nonperforated appendicitis.  He had blood cultures drawn as part of his initial work-up.  He was taken to the operating room yesterday by Dr. Bedelia Person for laparoscopic appendectomy.  He was discharged home postoperatively.  He reports at home he had a maximum temperature of 100 and some soreness at his incisions, but has overall been feeling well.  He has tolerated some p.o. intake and has not had any nausea or vomiting.  Today his blood cultures, which were drawn prior to surgery, resulted with E. coli and Enterobacterales.  He was called and instructed to return to the ED.  General surgery was consulted for admission.  WBC is 19, down from 27 preop.   ROS: Review of Systems  Constitutional:  Negative for chills and fever.  Eyes:  Negative for redness.  Respiratory:  Negative for shortness of breath and wheezing.   Cardiovascular:  Negative for chest pain.  Gastrointestinal:  Positive for abdominal pain. Negative for nausea and vomiting.  Neurological:  Negative for seizures and loss of consciousness.   No family history on file.  History reviewed. No pertinent past medical history.  Past Surgical History:  Procedure Laterality Date   APPENDECTOMY     LAPAROSCOPIC APPENDECTOMY N/A 10/08/2021   Procedure: APPENDECTOMY LAPAROSCOPIC;  Surgeon: Diamantina Monks, MD;  Location: [facility] OR;  Service: General;  Laterality: N/A;   ORIF RADIAL FRACTURE Right 07/14/2019   ORIF WRIST FRACTURE Right 07/14/2019   Procedure: Right distal radius open reduction, internal fixation and surgery as indicated;  Surgeon: Ernest Mallick, MD;  Location: [facility] OR;  Service: Orthopedics;  Laterality: Right;     Social History:  reports  that he has never smoked. He has never used smokeless tobacco. He reports that he does not drink alcohol and does not use drugs.  Allergies: No Known Allergies  (Not in a hospital admission)    Physical Exam: Blood pressure 113/82, pulse 88, temperature 99.9 F (37.7 C), temperature source Oral, resp. rate (!) 30, SpO2 97 %. General: resting comfortably, appears stated age, no apparent distress Neurological: alert and oriented, no focal deficits, cranial nerves grossly in tact HEENT: normocephalic, atraumatic, oropharynx clear, no scleral icterus CV: regular rate and rhythm, extremities warm and well-perfused Respiratory: normal work of breathing, lungs clear to auscultation bilaterally, symmetric chest wall expansion Abdomen: soft, nondistended, mildly tender to palpation.  Incisions are clean and dry with no erythema or induration. No masses or organomegaly. Extremities: warm and well-perfused, no deformities, moving all extremities spontaneously Psychiatric: normal mood and affect Skin: warm and dry, no jaundice, no rashes or lesions   Results for orders placed or performed during the hospital encounter of 10/08/21 (from the past 48 hour(s))  Urinalysis, Routine w reflex microscopic     Status: Abnormal   Collection Time: 10/08/21  9:13 PM  Result Value Ref Range   Color, Urine YELLOW YELLOW   APPearance CLEAR CLEAR   Specific Gravity, Urine 1.017 1.005 - 1.030   pH 5.0 5.0 - 8.0   Glucose, UA NEGATIVE NEGATIVE mg/dL   Hgb urine dipstick MODERATE (A) NEGATIVE   Bilirubin Urine NEGATIVE NEGATIVE   Ketones, ur 5 (A) NEGATIVE mg/dL   Protein, ur NEGATIVE NEGATIVE mg/dL   Nitrite NEGATIVE NEGATIVE  Leukocytes,Ua NEGATIVE NEGATIVE   RBC / HPF 0-5 0 - 5 RBC/hpf   WBC, UA 0-5 0 - 5 WBC/hpf   Bacteria, UA NONE SEEN NONE SEEN   Squamous Epithelial / LPF 0-5 0 - 5   Mucus PRESENT     Comment: Performed at [facility], [address], [place], [place] [address]  CBC with  Differential     Status: Abnormal   Collection Time: 10/08/21  9:26 PM  Result Value Ref Range   WBC 19.6 (H) 4.0 - 10.5 K/uL   RBC 4.79 4.22 - 5.81 MIL/uL   Hemoglobin 14.4 13.0 - 17.0 g/dL   HCT 64.4 03.4 - 74.2 %   MCV 90.8 80.0 - 100.0 fL   MCH 30.1 26.0 - 34.0 pg   MCHC 33.1 30.0 - 36.0 g/dL   RDW 59.5 63.8 - 75.6 %   Platelets 249 150 - 400 K/uL   nRBC 0.0 0.0 - 0.2 %   Neutrophils Relative % 88 %   Neutro Abs 17.4 (H) 1.7 - 7.7 K/uL   Lymphocytes Relative 5 %   Lymphs Abs 1.0 0.7 - 4.0 K/uL   Monocytes Relative 5 %   Monocytes Absolute 1.0 0.1 - 1.0 K/uL   Eosinophils Relative 1 %   Eosinophils Absolute 0.1 0.0 - 0.5 K/uL   Basophils Relative 0 %   Basophils Absolute 0.0 0.0 - 0.1 K/uL   Immature Granulocytes 1 %   Abs Immature Granulocytes 0.09 (H) 0.00 - 0.07 K/uL    Comment: Performed at [facility], [address], [place], [place] [address]  Comprehensive metabolic panel     Status: Abnormal   Collection Time: 10/08/21  9:26 PM  Result Value Ref Range   Sodium 137 135 - 145 mmol/L   Potassium 4.1 3.5 - 5.1 mmol/L   Chloride 102 98 - 111 mmol/L   CO2 25 22 - 32 mmol/L   Glucose, Bld 140 (H) 70 - 99 mg/dL    Comment: Glucose reference range applies only to samples taken after fasting for at least 8 hours.   BUN 15 6 - 20 mg/dL   Creatinine, Ser 5.18 (H) 0.61 - 1.24 mg/dL   Calcium 8.6 (L) 8.9 - 10.3 mg/dL   Total Protein 7.3 6.5 - 8.1 g/dL   Albumin 3.5 3.5 - 5.0 g/dL   AST 37 15 - 41 U/L   ALT 65 (H) 0 - 44 U/L   Alkaline Phosphatase 76 38 - 126 U/L   Total Bilirubin 1.1 0.3 - 1.2 mg/dL   GFR, Estimated >84 >16 mL/min    Comment: (NOTE) Calculated using the CKD-EPI Creatinine Equation (2021)    Anion gap 10 5 - 15    Comment: Performed at [facility], [address], [place], [place] [address]  Lactic acid, plasma     Status: None   Collection Time: 10/08/21  9:26 PM  Result Value Ref Range   Lactic Acid, Venous 1.6 0.5 - 1.9 mmol/L     Comment: Performed at [facility], [address], [place], [place] [address]   CT Abdomen Pelvis W Contrast  Result Date: 10/08/2021 CLINICAL DATA:  Abdominal pain, fever EXAM: CT ABDOMEN AND PELVIS WITH CONTRAST TECHNIQUE: Multidetector CT imaging of the abdomen and pelvis was performed using the standard protocol following bolus administration of intravenous contrast. CONTRAST:  56mL OMNIPAQUE IOHEXOL 350 MG/ML SOLN COMPARISON:  None. FINDINGS: Lower chest: Lung bases are clear.  Hepatobiliary: Liver is within normal limits. Gallbladder is unremarkable. No intrahepatic or extrahepatic ductal dilatation. Pancreas: Within normal limits. Spleen: Within normal limits. Adrenals/Urinary Tract: Adrenal glands are within normal limits. Kidneys are within normal limits.  No hydronephrosis. Bladder is within normal limits. Stomach/Bowel: Stomach is within normal limits. No evidence of bowel obstruction. Dilated appendix with periappendiceal inflammatory changes (series 3/image 81), reflecting acute appendicitis. No appendicolith. No drainable fluid collection/abscess. No free air to suggest macroscopic perforation. Vascular/Lymphatic: No evidence of abdominal aortic aneurysm. No suspicious abdominopelvic lymphadenopathy. Reproductive: Prostate is unremarkable. Other: Tiny fat containing bilateral inguinal hernias. No abdominopelvic ascites. Musculoskeletal: Visualized osseous structures are within normal limits. IMPRESSION: Uncomplicated acute appendicitis. No drainable fluid collection/abscess. No free air to suggest macroscopic perforation. Electronically Signed   By: Charline Bills M.D.   On: 10/08/2021 00:16      Assessment/Plan This is a 40 year old male who is postop day 1 s/p laparoscopic appendectomy, with gram-negative bacteremia on preoperative cultures.  The source was presumably as appendicitis.  Anticipate his bacteremia will clear quickly now that source control has been obtained. -Begin  IV antibiotics, Zosyn given in ED. -Regular diet -To medical pain control -New blood cultures to be drawn -VTE: lovenox, SCDs -Dispo: admit to observation.    Sophronia Simas, MD [facility] General, Hepatobiliary and Pancreatic Surgery 10/09/21 3:48 AM

## 2021-10-09 NOTE — ED Notes (Signed)
Pt ambulatory to restroom without assistance. Continent B/B. Son at bedside is assisting with translation although pt is able to communicate most needs without interpreter

## 2021-10-10 MED ORDER — AMOXICILLIN-POT CLAVULANATE 875-125 MG PO TABS: 1.0000 | ORAL_TABLET | Freq: Two times a day (BID) | ORAL | 0 refills | Status: AC

## 2021-10-10 MED ORDER — ACETAMINOPHEN 500 MG PO TABS: 1000.0000 mg | ORAL_TABLET | Freq: Three times a day (TID) | ORAL | 0 refills | Status: AC | PRN

## 2021-10-10 MED ORDER — OXYCODONE HCL 5 MG PO TABS: 5.0000 mg | ORAL_TABLET | Freq: Four times a day (QID) | ORAL | 0 refills | Status: AC | PRN

## 2021-10-10 NOTE — Discharge Summary (Signed)
[facility] Discharge Summary   Patient ID: Angel Yates MRN: [id number] DOB/AGE: 39-May-1982 39 y.o.  Admit date: 10/08/2021 Discharge date: 10/10/2021  Admitting Diagnosis: S/p laparoscopic appendectomy  E. Coli bacteremia   Discharge Diagnosis Same as above   Consultants None   Imaging: No results found.  Procedures Dr. Kris Mouton (10/08/21) - Laparoscopic Appendectomy  Hospital Course:  Patient is a 40 year old male who underwent laparoscopic appendectomy on 11/12 as listed above. This went well and patient was discharged home from the PACU post-operatively. Patient was later noted to have positive blood cultures and called by ED provider and told to present back to the ED. He waited in ED for several hours and then general surgery called for readmission. Patient was hemodynamically stable but readmitted for 24 hrs IV abx. He was felt stable for discharge home on PO abx 10/10/21.   Physical Exam: General:  Alert, NAD, pleasant, comfortable Abd:  Soft, ND, mild tenderness, incisions C/D/I  I or a member of my team have reviewed this patient in the Controlled Substance Database.   Allergies as of 10/10/2021   No Known Allergies      Medication List     TAKE these medications    acetaminophen 500 MG tablet Commonly known as: TYLENOL Take 2 tablets (1,000 mg total) by mouth every 8 (eight) hours as needed for mild pain or fever.   amoxicillin-clavulanate 875-125 MG tablet Commonly known as: Augmentin Take 1 tablet by mouth 2 (two) times daily for 14 days.   docusate sodium 100 MG capsule Commonly known as: Colace Take 1 capsule (100 mg total) by mouth 2 (two) times daily.   ibuprofen 600 MG tablet Commonly known as: ADVIL Take 1 tablet (600 mg total) by mouth 4 (four) times daily.   methocarbamol 750 MG tablet Commonly known as: Robaxin-750 Take 1 tablet (750 mg total) by mouth 4 (four) times daily.   oxyCODONE 5 MG immediate  release tablet Commonly known as: Roxicodone Take 1 tablet (5 mg total) by mouth every 6 (six) hours as needed for severe pain or moderate pain. What changed:  when to take this reasons to take this          Follow-up Information     [facility]. Go on 10/25/2021.   Specialty: General Surgery Why: 11:45 AM. Please arrive 30 min prior to appointment time. Contact information: [address] [place] [address] [phone]                 Signed: Juliet Rude , [facility] 10/10/2021, 9:33 AM Please see Amion for pager number during day hours 7:00am-4:30pm

## 2021-10-11 LAB — SURGICAL PATHOLOGY

## 2021-10-14 LAB — CULTURE, BLOOD (ROUTINE X 2): Special Requests: ADEQUATE

## 2021-10-14 LAB — CULTURE, BLOOD (SINGLE)
Culture: NO GROWTH
Special Requests: ADEQUATE

## 2021-10-15 LAB — CULTURE, BLOOD (ROUTINE X 2): Special Requests: ADEQUATE

## 2021-11-08 NOTE — Discharge Summary (Addendum)
° ° °  Patient ID: Angel Yates [id number] 08-Oct-1981 40 y.o.  Admit date: 10/07/2021 Discharge date: 10/08/2021  Admitting Diagnosis: appedicitis  Discharge Diagnosis Patient Active Problem List   Diagnosis Date Noted   Gram-negative bacteremia 10/09/2021   Appendicitis, acute 10/08/2021   Closed fracture of right distal radius 07/14/2019    Consultants none  Reason for Admission: appednicitis  Procedures Lap appendectomy  Hospital Course:  Uncomplicated    Physical Exam: Gen: comfortable, no distress Neuro: non-focal exam HEENT: PERRL Neck: supple CV: RRR Pulm: unlabored breathing Abd: soft, NT GU: clear yellow urine Extr: wwp, no edema   Allergies as of 10/08/2021   No Known Allergies      Medication List     STOP taking these medications    HYDROcodone-acetaminophen 5-325 MG tablet Commonly known as: NORCO/VICODIN   tamsulosin 0.4 MG Caps capsule Commonly known as: Flomax       TAKE these medications    docusate sodium 100 MG capsule Commonly known as: Colace Take 1 capsule (100 mg total) by mouth 2 (two) times daily.   ibuprofen 600 MG tablet Commonly known as: ADVIL Take 1 tablet (600 mg total) by mouth 4 (four) times daily. What changed:  medication strength when to take this reasons to take this   methocarbamol 750 MG tablet Commonly known as: Robaxin-750 Take 1 tablet (750 mg total) by mouth 4 (four) times daily.          Follow-up Information     [facility]. Call.   Specialty: General Surgery Why: Rolm Gala est organizando una cita de seguimiento en 3 a 4 semanas. Por favor llame para confirmar la fecha y hora de la cita. Contact information: [address] [place] [address] [phone]                  Signed: Diamantina Monks, MD [facility] 11/08/2021, 3:12 PM
